# Patient Record
Sex: Male | Born: 1995 | Race: Black or African American | Hispanic: No | Marital: Single | Smoking: Never smoker
Health system: Southern US, Community
[De-identification: ages and names within clinical notes are randomized; demographics above are authoritative.]

## PROBLEM LIST (undated history)

## (undated) DIAGNOSIS — R079 Chest pain, unspecified: Secondary | ICD-10-CM

## (undated) DIAGNOSIS — R569 Unspecified convulsions: Secondary | ICD-10-CM

## (undated) HISTORY — DX: Chest pain, unspecified: R07.9

## (undated) HISTORY — DX: Unspecified convulsions: R56.9

## (undated) HISTORY — PX: CIRCUMCISION: SHX1350

---
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
## 2014-01-14 ENCOUNTER — Other Ambulatory Visit: Payer: Self-pay | Admitting: Family

## 2014-01-17 ENCOUNTER — Encounter: Payer: Self-pay | Admitting: Family

## 2014-02-18 ENCOUNTER — Other Ambulatory Visit: Payer: Self-pay | Admitting: Family

## 2014-03-16 ENCOUNTER — Other Ambulatory Visit: Payer: Self-pay | Admitting: Family

## 2014-03-17 ENCOUNTER — Telehealth: Payer: Self-pay | Admitting: *Deleted

## 2014-03-17 ENCOUNTER — Encounter: Payer: Self-pay | Admitting: *Deleted

## 2014-03-17 NOTE — Telephone Encounter (Signed)
Benjamin Wagner the patient's mom called and stated she needs a refill on Lamotrigine 25 mg. Has been electronically sent and patient's mom has been called. MB

## 2014-03-17 NOTE — Telephone Encounter (Signed)
This encounter was created in error - please disregard.

## 2014-03-19 DIAGNOSIS — Z0289 Encounter for other administrative examinations: Secondary | ICD-10-CM

## 2014-03-29 ENCOUNTER — Ambulatory Visit: Payer: Medicaid Other | Admitting: Pediatrics

## 2014-04-05 ENCOUNTER — Encounter: Payer: Self-pay | Admitting: *Deleted

## 2014-04-13 ENCOUNTER — Other Ambulatory Visit: Payer: Self-pay | Admitting: Family

## 2014-05-16 ENCOUNTER — Other Ambulatory Visit: Payer: Self-pay | Admitting: Family

## 2014-06-13 ENCOUNTER — Other Ambulatory Visit: Payer: Self-pay | Admitting: Family

## 2014-07-16 ENCOUNTER — Other Ambulatory Visit: Payer: Self-pay | Admitting: Family

## 2014-08-15 ENCOUNTER — Other Ambulatory Visit: Payer: Self-pay | Admitting: Family

## 2014-08-15 ENCOUNTER — Encounter: Payer: Self-pay | Admitting: Family

## 2014-09-01 ENCOUNTER — Encounter: Payer: Self-pay | Admitting: Pediatrics

## 2014-09-13 ENCOUNTER — Encounter: Payer: Self-pay | Admitting: Family

## 2014-09-13 ENCOUNTER — Ambulatory Visit (INDEPENDENT_AMBULATORY_CARE_PROVIDER_SITE_OTHER): Payer: Medicaid Other | Admitting: Family

## 2014-09-13 VITALS — BP 116/70 | HR 86 | Ht 68.5 in | Wt 150.6 lb

## 2014-09-13 DIAGNOSIS — G40309 Generalized idiopathic epilepsy and epileptic syndromes, not intractable, without status epilepticus: Secondary | ICD-10-CM | POA: Insufficient documentation

## 2014-09-13 DIAGNOSIS — F70 Mild intellectual disabilities: Secondary | ICD-10-CM | POA: Diagnosis not present

## 2014-09-13 DIAGNOSIS — G40409 Other generalized epilepsy and epileptic syndromes, not intractable, without status epilepticus: Secondary | ICD-10-CM

## 2014-09-13 MED ORDER — LAMOTRIGINE 25 MG PO TABS: ORAL_TABLET | ORAL | Status: DC

## 2014-09-13 NOTE — Patient Instructions (Signed)
Continue taking Lamotrigine 25 - 1 tablet in the morning, 2 tablets at midday and 3 tablets at night. I have sent in a refill of this medication to your pharmacy. It is important not to miss any doses of medication.  Remember to get at least 8-9 hours of sleep at night as lack of sleep may trigger seizures.  Please plan to return for follow up in 1 year or sooner if needed.

## 2014-09-13 NOTE — Progress Notes (Signed)
Patient: Benjamin Wagner MRN: [id number] Sex: male DOB: [birth year]  Provider: Elveria Rising, NP Location of Care: [facility]  Note type: Routine return visit  History of Present Illness: Referral Source: Dr. Alma Downs  History from: patient and his mother Chief Complaint: Epilepsy/Mild Intellectual Disabilities   Benjamin Wagner is a 19 y.o. 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 He was last seen by Dr. Sharene Skeans on June 15, 2013. Benjamin Wagner is taking and tolerating Lamotrigine, which has given him good seizure control. He and his mother report today that his last seizure occurred in May 2013. Since he was last seen, Benjamin Wagner has graduated from high school with a certificate. He is not employed. His mother said that he received some vocational training while in school but that he has been unable to get a job. Benjamin Wagner says that he goes to [employer] to spend time when he is not at home. His mother says that he sleeps well. She says that there are no concerns with his behavior and that he has been generally healthy since he was last seen.  Review of Systems: 12 system review as per HPI, otherwise negative.  Past Medical History  Diagnosis Date  . Seizures    Hospitalizations: No., Head Injury: No., Nervous System Infections: No., Immunizations up to date: Yes.   Past Medical History Comments: EEG on August 02, 2005, showed frequent three to four hertz regularly contoured spike in slow wave of activity that happened in one and a half to two-second intervals multiple times and also in single burst. This occurred at rest, with hyperventilation and has further myoclonic responses. He had a seizure in April 2007 with left focal motor activity with secondary generalization. On February 24, 2008, the patient had repeat EEG, which again showed frequent burst of generalized four hertz spike and slow  wave activity in single bursts and in clusters lasting up to two seconds in duration. The episodes did not occur during photic stimulation and were exacerbated by hyperventilation. His background was otherwise normal. He had a repeat EEG on October 08, 2012, that showed three episodes of generalized regularly contoured spike and slow wave activity followed by two episodes of four hertz regularly contoured spike and slow wave activity that was frontally prominent, but generalized. The episodes were lasted for a second or less. Background activity was otherwise normal.  The patient was initially placed on Lamictal because of his abnormal EEG and the potential for having further seizures. In 2009, he was on 25 mg twice daily. He had a normal examination. Plans were made to perform an EEG at [facility] and to consider tapering and discontinuing the medications. He was lost to follow up and when seen again was taking Lamotrigine at a dose of 25 mg in the morning and 50 mg in the afternoon and 75 mg at nighttime.  Other medical problems for this young man included acne, hypercholesterolemia, and intellectual disability. For reasons that are unclear, he had been placed on metformin, though he was not obese. He had normal blood sugar and hemoglobin A1c of 5.8.  In high school, he was in the self-maintained life skills class and participated in [place]. He has an older brother with cognitive impairment and autism. His IQ has been measured as full scale IQ 41 and verbal 45.   Surgical History Past Surgical History  Procedure Laterality Date  . Circumcision  1996    Family History family  history is not on file. Family History is otherwise negative for migraines, seizures, cognitive impairment, blindness, deafness, birth defects, chromosomal disorder, autism.  Social History History   Social History  . Marital Status: Single    Spouse Name: N/A  . Number of Children: N/A  . Years of Education:  N/A   Social History Main Topics  . Smoking status: Passive Smoke Exposure - Never Smoker  . Smokeless tobacco: Never Used     Comment: Mom smokes   . Alcohol Use: No  . Drug Use: No  . Sexual Activity: No   Other Topics Concern  . None   Social History Narrative   Educational level: 12th grade School Attending: N/A Living with:  mother  Hobbies/Interest: Enjoys going to the [employer]  School comments:  Benjamin DeerChristopher graduated with a certificate from [employer] in 2015.  Allergies No Known Allergies  Physical Exam BP 116/70 mmHg  Pulse 86  Ht 5' 8.5" (1.74 m)  Wt 150 lb 9.6 oz (68.312 kg)  BMI 22.56 kg/m2 General: alert, well developed, well nourished, in no acute distress, brown hair, brown eyes, right handedness  Head: normocephalic, no dysmorphic features  Ears, Nose and Throat: Otoscopic: Tympanic membranes normal. Pharynx: oropharynx is pink without exudates or tonsillar hypertrophy.  Neck: supple, full range of motion, no cranial or cervical bruits  Respiratory: auscultation clear  Cardiovascular: no murmurs, pulses are normal  Musculoskeletal: no skeletal deformities or apparent scoliosis  Skin: no rashes or neurocutaneous lesions, mild facial acne  Neurologic Exam  Mental Status: alert; oriented to person, place and year; knowledge is below normal for age; language is acceptable, he can name objects and follow commands. His speech is dysarthric but intelligible.  Cranial Nerves: visual fields are full to double simultaneous stimuli; extraocular movements are full and conjugate; pupils are around reactive to light; funduscopic examination shows sharp disc margins with normal vessels; symmetric facial strength; midline tongue and uvula; hearing is normal and symmetric. Motor: Normal strength, tone and mass; good fine motor movements; no pronator drift.  Sensory: intact responses to touch and temperature Coordination: good finger-to-nose,  rapid repetitive alternating movements and finger apposition  Gait and Station: normal gait and station: patient is able to walk on heels, toes and tandem without difficulty; balance is adequate; Romberg exam is negative; Gower response is negative  Reflexes: symmetric and diminished bilaterally; no clonus; bilateral flexor plantar responses.  Impression 1.  Generalized convulsive epilepsy 2.  Mild intellectual disabilities  Recommendations for plan of care [facility]. The patient is doing well on his current regimen of Lamotrigine. His mother says that she does not find the 3 times per day dosing to be cumbersome. Since this plan has worked well for Guardian Life InsuranceChristopher, I will not make changes in his plan of care. I explained to Benjamin DeerChristopher and his mother why that it was important for him to be seen in this office at least once per year to monitor his condition and his tolerance to the medication. I will see him back in follow up in 1 year or sooner if needed.   Current outpatient prescriptions:  .  lamoTRIgine (LAMICTAL) 25 MG tablet, TAKE 1 TABLET BY MOUTH EVERY MORNING, TAKE 2 TABLETS AT MIDDAY AND 3 TABLETS EVERY NIGHT AT BEDTIME, Disp: 180 tablet, Rfl: 5  The medication list was reviewed and reconciled. A complete medication list was provided to the patient/caregiver.  Patient Education I reminded Benjamin DeerChristopher of the need  for him to be compliant his medication regimen and of the relationship of getting adequate sleep to good seizure control in epilepsy. I instructed his mother to contact me if he has any breakthrough seizures so that he can be evaluated and his medication dose be adjusted if needed.   Total time spent with the patient was 25 minutes, of which 50% or more was spent in counseling and coordination of care.

## 2014-09-29 ENCOUNTER — Inpatient Hospital Stay
Admission: EM | Admit: 2014-09-29 | Discharge: 2014-09-30 | DRG: 153 | Disposition: A | Discharge status: Home / Self Care | Payer: BLUE CROSS/BLUE SHIELD | Attending: Pediatrics | Admitting: Pediatrics

## 2014-09-29 ENCOUNTER — Emergency Department: Payer: BLUE CROSS/BLUE SHIELD

## 2014-09-29 ENCOUNTER — Inpatient Hospital Stay: Payer: BLUE CROSS/BLUE SHIELD | Admitting: Pediatrics

## 2014-09-29 DIAGNOSIS — R252 Cramp and spasm: Secondary | ICD-10-CM | POA: Diagnosis present

## 2014-09-29 DIAGNOSIS — R1012 Left upper quadrant pain: Secondary | ICD-10-CM | POA: Diagnosis present

## 2014-09-29 DIAGNOSIS — J029 Acute pharyngitis, unspecified: Secondary | ICD-10-CM

## 2014-09-29 DIAGNOSIS — R6884 Jaw pain: Secondary | ICD-10-CM | POA: Diagnosis present

## 2014-09-29 DIAGNOSIS — J039 Acute tonsillitis, unspecified: Secondary | ICD-10-CM | POA: Diagnosis present

## 2014-09-29 DIAGNOSIS — R1310 Dysphagia, unspecified: Secondary | ICD-10-CM | POA: Diagnosis present

## 2014-09-29 DIAGNOSIS — R070 Pain in throat: Secondary | ICD-10-CM | POA: Diagnosis present

## 2014-09-29 DIAGNOSIS — E860 Dehydration: Secondary | ICD-10-CM | POA: Diagnosis present

## 2014-09-29 DIAGNOSIS — R590 Localized enlarged lymph nodes: Secondary | ICD-10-CM | POA: Diagnosis present

## 2014-09-29 DIAGNOSIS — J36 Peritonsillar abscess: Principal | ICD-10-CM | POA: Diagnosis present

## 2014-09-29 DIAGNOSIS — J45909 Unspecified asthma, uncomplicated: Secondary | ICD-10-CM | POA: Diagnosis present

## 2014-09-29 LAB — COMPREHENSIVE METABOLIC PANEL
ALT: 13 U/L (ref 0–55)
AST (SGOT): 19 U/L (ref 5–34)
Albumin/Globulin Ratio: 1.2 (ref 0.9–2.2)
Albumin: 4.4 g/dL (ref 3.5–5.0)
Alkaline Phosphatase: 78 U/L (ref 65–260)
Anion Gap: 14 (ref 5.0–15.0)
BUN: 11.6 mg/dL (ref 9.0–28.0)
Bilirubin, Total: 0.9 mg/dL (ref 0.2–1.2)
CO2: 23 mEq/L (ref 22–29)
Calcium: 9.9 mg/dL (ref 8.5–10.5)
Chloride: 99 mEq/L — ABNORMAL LOW (ref 100–111)
Creatinine: 1.1 mg/dL (ref 0.7–1.3)
Globulin: 3.8 g/dL — ABNORMAL HIGH (ref 2.0–3.6)
Glucose: 102 mg/dL — ABNORMAL HIGH (ref 70–100)
Potassium: 4.1 mEq/L (ref 3.5–5.1)
Protein, Total: 8.2 g/dL (ref 6.0–8.3)
Sodium: 136 mEq/L (ref 136–145)

## 2014-09-29 LAB — CBC AND DIFFERENTIAL
Basophils Absolute Automated: 0.04 10*3/uL (ref 0.00–0.20)
Basophils Automated: 0 %
Eosinophils Absolute Automated: 0 10*3/uL (ref 0.00–0.70)
Eosinophils Automated: 0 %
Hematocrit: 36.6 % — ABNORMAL LOW (ref 42.0–52.0)
Hgb: 12.7 g/dL — ABNORMAL LOW (ref 13.0–17.0)
Immature Granulocytes Absolute: 0.04 10*3/uL
Immature Granulocytes: 0 %
Lymphocytes Absolute Automated: 1.42 10*3/uL (ref 0.50–4.40)
Lymphocytes Automated: 10 %
MCH: 30.9 pg (ref 28.0–32.0)
MCHC: 34.7 g/dL (ref 32.0–36.0)
MCV: 89.1 fL (ref 80.0–100.0)
MPV: 10.9 fL (ref 9.4–12.3)
Monocytes Absolute Automated: 1.96 10*3/uL — ABNORMAL HIGH (ref 0.00–1.20)
Monocytes: 14 %
Neutrophils Absolute: 11.03 10*3/uL — ABNORMAL HIGH (ref 1.80–8.10)
Neutrophils: 76 %
Platelets: 210 10*3/uL (ref 140–400)
RBC: 4.11 10*6/uL — ABNORMAL LOW (ref 4.70–6.00)
RDW: 13 % (ref 12–15)
WBC: 14.45 10*3/uL — ABNORMAL HIGH (ref 3.50–10.80)

## 2014-09-29 LAB — MONONUCLEOSIS SCREEN: Mono Screen: NEGATIVE

## 2014-09-29 LAB — C-REACTIVE PROTEIN: C-Reactive Protein: 11.9 mg/dL — ABNORMAL HIGH (ref 0.0–0.8)

## 2014-09-29 LAB — CELL MORPHOLOGY
Cell Morphology: NORMAL
Platelet Estimate: NORMAL

## 2014-09-29 MED ORDER — CLINDAMYCIN PHOSPHATE IN D5W 600 MG/50ML IV SOLN
600.0000 mg | Freq: Once | INTRAVENOUS | Status: AC
Start: 2014-09-29 — End: 2014-09-29
  Administered 2014-09-29: 600 mg via INTRAVENOUS
  Filled 2014-09-29: qty 50

## 2014-09-29 MED ORDER — FENTANYL CITRATE 0.05 MG/ML IJ SOLN
1.0000 ug/kg | Freq: Once | INTRAMUSCULAR | Status: AC
Start: 2014-09-29 — End: 2014-09-29
  Administered 2014-09-29: 73 ug via INTRAVENOUS
  Filled 2014-09-29: qty 2

## 2014-09-29 MED ORDER — SODIUM CHLORIDE 0.9 % IV BOLUS
20.0000 mL/kg | Freq: Once | INTRAVENOUS | Status: AC
Start: 2014-09-29 — End: 2014-09-29
  Administered 2014-09-29: 1464 mL via INTRAVENOUS

## 2014-09-29 MED ORDER — DEXTROSE-SODIUM CHLORIDE 5-0.45 % IV SOLN
Freq: Once | INTRAVENOUS | Status: AC
Start: 2014-09-29 — End: 2014-09-29

## 2014-09-29 MED ORDER — CLINDAMYCIN PHOSPHATE IN D5W 600 MG/50ML IV SOLN
600.0000 mg | Freq: Three times a day (TID) | INTRAVENOUS | Status: DC
Start: 2014-09-30 — End: 2014-09-30
  Administered 2014-09-30 (×2): 600 mg via INTRAVENOUS
  Filled 2014-09-29 (×3): qty 50

## 2014-09-29 MED ORDER — MORPHINE SULFATE 4 MG/ML IJ/IV SOLN (WRAP)
4.0000 mg | Status: DC | PRN
Start: 2014-09-29 — End: 2014-09-30
  Administered 2014-09-29: 4 mg via INTRAVENOUS
  Filled 2014-09-29: qty 1

## 2014-09-29 MED ORDER — KETOROLAC TROMETHAMINE 30 MG/ML IJ SOLN
30.0000 mg | Freq: Once | INTRAMUSCULAR | Status: AC
Start: 2014-09-29 — End: 2014-09-29
  Administered 2014-09-29: 30 mg via INTRAVENOUS
  Filled 2014-09-29: qty 1

## 2014-09-29 MED ORDER — DEXTROSE-SODIUM CHLORIDE 5-0.45 % IV SOLN
INTRAVENOUS | Status: DC
Start: 2014-09-29 — End: 2014-09-30

## 2014-09-29 MED ORDER — METHYLPREDNISOLONE SODIUM SUCC 125 MG IJ SOLR
125.0000 mg | Freq: Once | INTRAMUSCULAR | Status: AC
Start: 2014-09-29 — End: 2014-09-29
  Administered 2014-09-29: 125 mg via INTRAVENOUS
  Filled 2014-09-29: qty 2

## 2014-09-29 MED ORDER — KETOROLAC TROMETHAMINE 30 MG/ML IJ SOLN
30.0000 mg | Freq: Once | INTRAMUSCULAR | Status: AC
Start: 2014-09-30 — End: 2014-09-30
  Administered 2014-09-30: 30 mg via INTRAVENOUS
  Filled 2014-09-29: qty 1

## 2014-09-29 MED ORDER — IOHEXOL 350 MG/ML IV SOLN
100.0000 mL | Freq: Once | INTRAVENOUS | Status: AC | PRN
Start: 2014-09-29 — End: 2014-09-29
  Administered 2014-09-29: 100 mL via INTRAVENOUS

## 2014-09-29 NOTE — Student H&P (Signed)
Barry Gross is an 19 y.o. male with no significant PMH who presents with 3 days of severe sore throat. He states that the pain was 10/10 3 days ago and he could not eat, drink, or sleep. He also developed right ear pain, chest pain, chills, and LUQ pain over the next two days. Two days ago he went to his PCP who gave him lidocaine mouth wash to gargle with no relief. He went to urgent care yesterday who instructed him to come to the ED. He denies any sick contacts or recent travel.    History reviewed. No pertinent past medical history.     PSH: none    FH:  Paternal grandmother with high cholesterol, diabetes  Father had cancer (uncertain of type)    Social history: lives at home with mother and younger brother (10). 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    Allergies: No Known Allergies    Active Problems:    Throat pain    Blood pressure 133/77, pulse 78, temperature 98.5 F (36.9 C), temperature source Temporal Artery, resp. rate 18, height 1.68 m (5' 6.14"), weight 76.8 kg (169 lb 5 oz), SpO2 100 %.    Review of Systems   Constitutional: Positive for fever and chills.   HENT: Positive for ear pain (right) and sore throat. Negative for congestion.    Eyes: Negative for blurred vision, double vision, pain and discharge.   Respiratory: Positive for cough. Negative for shortness of breath and wheezing.    Cardiovascular: Positive for chest pain.   Gastrointestinal: Negative for nausea, vomiting, abdominal pain, diarrhea and constipation.   Musculoskeletal: Negative for myalgias.   Skin: Negative for rash.   Neurological: Positive for headaches.       Physical Exam   Constitutional: He is oriented to person, place, and time. He appears well-developed and well-nourished. No distress.   HENT:   Head: Normocephalic and atraumatic.   Left Ear: External ear normal.   Right tonsil erythematous. Uvular deviation to left. No exudate. Right TM erythematous.   Eyes: Conjunctivae and EOM are normal. Pupils are equal, round, and  reactive to light. Right eye exhibits no discharge. Left eye exhibits no discharge.   Cardiovascular: Normal rate, regular rhythm and normal heart sounds.  Exam reveals no gallop and no friction rub.    No murmur heard.  Pulmonary/Chest: Effort normal and breath sounds normal. No respiratory distress. He has no wheezes. He has no rales. He exhibits no tenderness.   Abdominal: Soft. Bowel sounds are normal. He exhibits no distension and no mass. There is no tenderness. There is no rebound and no guarding.   Musculoskeletal: Normal range of motion.   Lymphadenopathy:     He has cervical adenopathy.   Neurological: He is alert and oriented to person, place, and time.   Skin: Skin is warm and dry. No rash noted. He is not diaphoretic.       Assessment:  19 year old male with no PMH who presents with peritonsillar abscess.    Plan:  CV: Stable, NTD  Resp: Stable, NTD  ID:  - IV clindamycin  Neuro:  - Toradol PRN pain  - Morphine PRN pain  HEENT:  - Consult ENT regarding peritonsillar abscess  FEN/GI:  - Maintenance IVF    Joetta Manners  09/29/2014

## 2014-09-29 NOTE — ED Provider Notes (Addendum)
Physician/Midlevel provider first contact with patient: 09/29/14 1722         History     Chief Complaint   Patient presents with   . Sore Throat     HPI Comments: Patient with a 5 day history of sore throat who has attempted to take Augmentin sterilize been unable to swallow at this time.  Severe sore throat and her lymph nodes on the right.  Tender throat on the left.  Still able to breathe without stridor, difficulty.  Unable to swallow solids    Patient is a 19 y.o. male presenting with pharyngitis. The history is provided by the patient. No language interpreter was used.   Sore Throat  This is a new problem. The current episode started in the past 7 days. The problem has been unchanged. Associated symptoms include abdominal pain, anorexia, congestion, a fever, nausea, neck pain, a sore throat and swollen glands. Pertinent negatives include no arthralgias, change in bowel habit, chest pain, chills, coughing, diaphoresis, fatigue, headaches, joint swelling, myalgias, numbness, rash, urinary symptoms, vertigo, visual change, vomiting or weakness. Associated symptoms comments: Left upper quadrant abdominal pain. Nothing aggravates the symptoms. He has tried nothing for the symptoms.            History reviewed. No pertinent past medical history.    History reviewed. No pertinent past surgical history.    History reviewed. No pertinent family history.    Social  History   Substance Use Topics   . Smoking status: Never Smoker    . Smokeless tobacco: Not on file   . Alcohol Use: No       .     No Known Allergies    Home Medications     Last Medication Reconciliation Action:  Complete Vivia Birmingham, RN 09/29/2014  9:54 PM                  amoxicillin-clavulanate (AUGMENTIN) 875-125 MG per tablet     Take 1 tablet by mouth 2 (two) times daily.           Review of Systems   Constitutional: Positive for fever and appetite change. Negative for chills, diaphoresis, activity change, fatigue and unexpected weight change.    HENT: Positive for congestion and sore throat. Negative for ear discharge, ear pain, mouth sores, nosebleeds, postnasal drip, rhinorrhea, sinus pressure, trouble swallowing and voice change.    Eyes: Negative for photophobia, pain, discharge, redness and itching.   Respiratory: Negative for cough, choking, chest tightness, shortness of breath, wheezing and stridor.    Cardiovascular: Negative for chest pain and palpitations.   Gastrointestinal: Positive for nausea, abdominal pain and anorexia. Negative for vomiting, diarrhea, constipation, abdominal distention and change in bowel habit.        Left upper quadrant abdominal pain   Genitourinary: Negative for frequency, hematuria, flank pain, decreased urine volume, enuresis and difficulty urinating.   Musculoskeletal: Positive for neck pain. Negative for myalgias, back pain, joint swelling, arthralgias, gait problem and neck stiffness.   Skin: Negative for color change, pallor and rash.   Allergic/Immunologic: Negative for environmental allergies, food allergies and immunocompromised state.   Neurological: Negative for dizziness, vertigo, seizures, syncope, weakness, light-headedness, numbness and headaches.   Hematological: Negative for adenopathy. Does not bruise/bleed easily.   Psychiatric/Behavioral: Negative for behavioral problems and agitation. The patient is not nervous/anxious.        Physical Exam    BP: 138/75 mmHg, Heart Rate: 68, Temp: 100 F (37.8  C), Resp Rate: 20, SpO2: 98 %, Weight: 73.2 kg    Physical Exam   Constitutional: He appears well-developed and well-nourished.   HENT:   Head: Normocephalic and atraumatic.   Right Ear: External ear normal.   Left Ear: External ear normal.   Mouth/Throat: No oropharyngeal exudate.   Patient with red pharyngitis bilaterally.  No uvular deviation, no jaw pain on range of motion.  Tender right anterior cervical lymph node.  No adenoid obstruction   Eyes: Conjunctivae are normal. Pupils are equal, round, and  reactive to light. Right eye exhibits no discharge. No scleral icterus.   Neck: Normal range of motion. No JVD present. No tracheal deviation present. No thyromegaly present.   Cardiovascular: Normal rate, regular rhythm, normal heart sounds and intact distal pulses.  Exam reveals no gallop and no friction rub.    No murmur heard.  Pulmonary/Chest: Effort normal. No respiratory distress. He has no wheezes. He has no rales. He exhibits no tenderness.   Abdominal: Soft. He exhibits no distension and no mass. There is no tenderness. There is no rebound and no guarding.   Left upper quadrant abdominal pain.  No palpable spleen   Musculoskeletal: Normal range of motion. He exhibits no edema or tenderness.   Lymphadenopathy:     He has no cervical adenopathy.   Neurological: He is alert. He displays normal reflexes. No cranial nerve deficit. He exhibits normal muscle tone. Coordination normal.   Skin: Skin is warm. No rash noted. He is not diaphoretic. No erythema. No pallor.   Psychiatric: He has a normal mood and affect. His behavior is normal. Thought content normal.         MDM and ED Course     ED Medication Orders     Start Ordered     Status Ordering Provider    09/29/14 1851 09/29/14 1850  clindamycin (CLEOCIN) 600mg  in D5W 50mL IVPB (premix)   Once     Route: Intravenous  Ordered Dose: 600 mg     Last MAR action:  Stopped Sharyne Richters D    09/29/14 1851 09/29/14 1850  methylPREDNISolone sodium succinate (Solu-MEDROL) injection 125 mg   Once     Route: Intravenous  Ordered Dose: 125 mg     Last MAR action:  Given Meryem Haertel D    09/29/14 1724 09/29/14 1723  sodium chloride 0.9 % bolus 1,464 mL   Once     Route: Intravenous  Ordered Dose: 20 mL/kg     Last MAR action:  Stopped Letha Mirabal D    09/29/14 1724 09/29/14 1723  dextrose  5 % and 0.45 % NaCl infusion   Once     Route: Intravenous     Last MAR action:  New Bag Selmer Adduci D    09/29/14 1724 09/29/14 1723  fentaNYL (SUBLIMAZE) injection 73  mcg   Once     Route: Intravenous  Ordered Dose: 1 mcg/kg     Last MAR action:  Given Tais Koestner D    09/29/14 1724 09/29/14 1723  ketorolac (TORADOL) injection 30 mg   Once     Route: Intravenous  Ordered Dose: 30 mg     Last MAR action:  Given Lakena Sparlin D              MDM  Number of Diagnoses or Management Options  Dehydration:   Peritonsillar abscess:   Pharyngitis:   Diagnosis management comments: 5:27 PM  SaO2 97-99%  normal  Additional Social History  The patient lives with family, goes to school, and does not smoke cigarettes or is not exposed to cigarette smoke.    Phillis Knack. Khamron Gellert MD, have reviewed old medical records if available and needed including previous emergency department visits, hospital admissions, laboratories, radiologic studies, EKG's, and previous treatment plans in the care of this patient.    Nurse's notes, vital signs, and past medical history as well as social history reviewed at length    Pharyngitis, mono, viral, bacterial, dehydration.  We will do IV fluids, pain medications subsequent imaging if warranted    Subsequent imaging reveals right peritonsillar abscess.  We will admit.  ENT consult.         Amount and/or Complexity of Data Reviewed  Clinical lab tests: reviewed  Tests in the radiology section of CPT: reviewed    Risk of Complications, Morbidity, and/or Mortality  Presenting problems: moderate  Diagnostic procedures: moderate  Management options: moderate    Patient Progress  Patient progress: stable         Procedures  Results     Procedure Component Value Units Date/Time    Epstein-Barr Virus VCA Antibody Panel [[id number]] Collected:  09/29/14 1735    Specimen Information:  Blood Updated:  09/29/14 2149    Cell MorpHology [[id number]] Collected:  09/29/14 1735     Cell Morphology: Normal Updated:  09/29/14 1812     Platelet Estimate Normal     CBC with differential [[id number]]  (Abnormal) Collected:  09/29/14 1735    Specimen Information:  Blood / Blood Updated:   09/29/14 1811     WBC 14.45 (H) x10 3/uL      Hgb 12.7 (L) g/dL      Hematocrit 21.3 (L) %      Platelets 210 x10 3/uL      RBC 4.11 (L) x10 6/uL      MCV 89.1 fL      MCH 30.9 pg      MCHC 34.7 g/dL      RDW 13 %      MPV 10.9 fL      Neutrophils 76 %      Lymphocytes Automated 10 %      Monocytes 14 %      Eosinophils Automated 0 %      Basophils Automated 0 %      Immature Granulocyte 0 %      Neutrophils Absolute 11.03 (H) x10 3/uL      Abs Lymph Automated 1.42 x10 3/uL      Abs Mono Automated 1.96 (H) x10 3/uL      Abs Eos Automated 0.00 x10 3/uL      Absolute Baso Automated 0.04 x10 3/uL      Absolute Immature Granulocyte 0.04 x10 3/uL     Comprehensive metabolic panel [[id number]]  (Abnormal) Collected:  09/29/14 1735    Specimen Information:  Blood Updated:  09/29/14 1803     Glucose 102 (H) mg/dL      BUN 96.2 mg/dL      Creatinine 1.1 mg/dL      Sodium 952 mEq/L      Potassium 4.1 mEq/L      Chloride 99 (L) mEq/L      CO2 23 mEq/L      CALCIUM 9.9 mg/dL      Protein, Total 8.2 g/dL      Albumin 4.4 g/dL      AST (SGOT) 19 U/L  ALT 13 U/L      Alkaline Phosphatase 78 U/L      Bilirubin, Total 0.9 mg/dL      Globulin 3.8 (H) g/dL      Albumin/Globulin Ratio 1.2      Anion Gap 14.0     C Reactive Protein [[id number]]  (Abnormal) Collected:  09/29/14 1735    Specimen Information:  Blood Updated:  09/29/14 1803     C-Reactive Protein 11.9 (H) mg/dL     Mononucleosis Screen [[id number]] Collected:  09/29/14 1735    Specimen Information:  Blood Updated:  09/29/14 1803     Mono Screen Negative       Ct Soft Tissue Neck With Contrast    09/29/2014    There is a right peritonsillar abscess present.  Results given via telephone to Dr. Dory Peru on 09/29/2014 at 7:36 PM  Trilby Drummer, MD  09/29/2014 7:36 PM     US Abdomen Complete    09/29/2014    No splenomegaly or other significant upper abdominal abnormality.  Annabell Sabal, MD  09/29/2014 6:31 PM     Clinical Impression & Disposition     Clinical Impression  Final  diagnoses:   Pharyngitis   Dehydration   Peritonsillar abscess        ED Disposition     Admit Bed Type: General [8]  Admitting Physician: Marvel Plan [facility] [[id number]]  Patient Class: Observation [104]             Current Discharge Medication List                      Ames Coupe, MD  09/30/14 4132    Ames Coupe, MD  09/30/14 4401    Ames Coupe, MD  09/30/14 [phone]

## 2014-09-29 NOTE — Plan of Care (Signed)
19 year old admitted for R. Peritonsillar abscess.  Assessment and history completed.  Pain control discussed with pt and family.  VSS.  Will monitor closely.

## 2014-09-29 NOTE — ED Notes (Signed)
Seen by PMD Tuesday, rx lidocaine po. Last night he was seen at urgent care (strep, flu negative), told throat 'looked terrible' and rx augmentin and prednisone. Only 1 dose of augmentin taken due to difficulty swallowing with pain. No prednisone given.

## 2014-09-30 DIAGNOSIS — J039 Acute tonsillitis, unspecified: Secondary | ICD-10-CM | POA: Diagnosis present

## 2014-09-30 LAB — EPSTEIN-BARR VIRUS VCA ANTIBODY PANEL
EBV EBNA Ab, IgG: 0.28 (ref <=0.90)
EBV VCA Ab, IgG: 0.15 (ref <=0.90)
EBV VCA Ab, IgM: 0.22 (ref <=0.90)

## 2014-09-30 MED ORDER — AMOXICILLIN-POT CLAVULANATE 600-42.9 MG/5ML PO SUSR
900.0000 mg | Freq: Two times a day (BID) | ORAL | Status: DC
Start: 2014-09-30 — End: 2014-10-18

## 2014-09-30 MED ORDER — KETOROLAC TROMETHAMINE 30 MG/ML IJ SOLN
30.0000 mg | Freq: Four times a day (QID) | INTRAMUSCULAR | Status: DC | PRN
Start: 2014-09-30 — End: 2014-09-30
  Administered 2014-09-30: 30 mg via INTRAVENOUS
  Filled 2014-09-30: qty 1

## 2014-09-30 MED ORDER — IBUPROFEN 100 MG PO CHEW
400.0000 mg | CHEWABLE_TABLET | Freq: Four times a day (QID) | ORAL | Status: DC | PRN
Start: 2014-09-30 — End: 2014-11-14

## 2014-09-30 MED ORDER — MORPHINE SULFATE 2 MG/ML IJ/IV SOLN (WRAP)
2.0000 mg | Status: DC | PRN
Start: 2014-09-30 — End: 2014-09-30

## 2014-09-30 MED ORDER — SODIUM CHLORIDE 0.9 % IV SOLN
10.0000 mg | Freq: Once | INTRAVENOUS | Status: AC
Start: 2014-09-30 — End: 2014-09-30
  Administered 2014-09-30: 10 mg via INTRAVENOUS
  Filled 2014-09-30: qty 1

## 2014-09-30 MED ORDER — ACETAMINOPHEN 160 MG PO CHEW
640.0000 mg | CHEWABLE_TABLET | ORAL | Status: DC | PRN
Start: 2014-09-30 — End: 2014-11-14

## 2014-09-30 MED ORDER — HYDROCODONE-ACETAMINOPHEN 7.5-325 MG/15ML PO SOLN
10.0000 mL | ORAL | Status: DC | PRN
Start: 2014-09-30 — End: 2014-09-30
  Administered 2014-09-30 (×2): 10 mL via ORAL
  Filled 2014-09-30 (×2): qty 15

## 2014-09-30 MED ORDER — PREDNISOLONE SODIUM PHOSPHATE 15 MG/5ML PO SOLN
ORAL | Status: DC
Start: 2014-09-30 — End: 2014-10-18

## 2014-09-30 MED ORDER — HYDROCODONE-ACETAMINOPHEN 7.5-325 MG/15ML PO SOLN
10.0000 mL | Freq: Four times a day (QID) | ORAL | Status: DC | PRN
Start: 2014-09-30 — End: 2014-11-14

## 2014-09-30 NOTE — Progress Notes (Signed)
Pt is a 19 yr old male admitted 09/28/14 for a retropharyngeal abscess.  Pt treated with IVF and IV antibiotics, steroids, IV and oral pain medications.  Pt's cond improved, tolerating po intake, pain controlled with oral medications.  Pt d/c'd home on antibiotics and pain medication, to f/u with ENT.

## 2014-09-30 NOTE — H&P (Signed)
PEDIATRIC ADMISSION HISTORY AND PHYSICAL EXAM    Admit Date and Time:  09/29/2014  5:14 PM   Today's Date and Time: 09/30/14   Patient Name: Barry Gross  Attending Physician: Gertie Fey, MD    Active Hospital Problems    Diagnosis   . Throat pain         Assessment:   19 y.o. male with tonsillitis with right tonsillar phlegmonous/ abscess changes    Er course:  Temp 100, pulse 68, O2 sat 98% on ra, rr 20, bp 138/75.   Per er note pt with red pharynx with tenderness to right anterior cervical lymph node, no jaw pain, no uvular deviation.   In ER pt given clindamycin, solumedrol 125mg , NS bolus of IVF  Fentanyl and toradol for pain.   Wbc 14.45, hgb 12.7, plt 210, 76% neut, 10% lymph, 14% mono  crp 11.9, Cr 1.1, bicarb 23  Soft tissue neck CT:   There is a multilobulated low-density peripherally enhancing  lesion in the right palatine tonsil. It measures 1.9 x 2.5 x 1.5 cm  (height x AP x width). This is felt to represent an abscess. Enlarged  lymph nodes are present in the bilateral upper internal jugular chain  nodal stations, especially on the right.    There is mild prominence of the adenoids and lingual tonsils.   RUQ usx:   IMPRESSION:   No splenomegaly or other significant upper abdominal  Abnormality.    Er provider spoke with ENT on call , Dr. Edward Jolly who will consult on the floor.     Floor: pt on IVF, clinda, and pain management.   On floor able to sleep   Eating and drinking better.   Pain manageable with pain medications.   ENT, Dr Edward Jolly, consulted. After evaluating pt, at this time does not recommend drainage. Okay to discharge home and follow up in office as outpatient. Cont clinda or augmentin for 10 more days, taper steroids over 5 days and if worsening then would drain in office.   Pt and mom understood.   Pt feels better and feels comfortable to go home.   Pt afebrile, eating better, pain under control with pain meds.   D/c home on augmentin.   Plan:   D/c home  Diagnosis:   1. Right  tonsillar phlegmon/ abscess/ tonsillitis    Instructions:   1. augmentin ES (600mg / 5ml) 7.4ml twice a day for 10 days  2. orapred 14 ml once a day for 3 days then 7ml once a day for 2 days (start tomorrow evening)  2. Ibuprofen 400-600 mg every 6hours as needed for pain or fever (take with solids)  3. hycet 10ml every 6 hours as needed for severe pain (take 4 hours apart from tyelnol)  4. Tylenol 640-650mg  every 4 hours as needed for pain or fever (take 4 hours apart from hycet)  5. May use cool mist humidifier  6. May gargle with warm salt water every 4 hours as needed for pain  7. Start with soft diet, as you are improving then advance to regular diet  8. Drink plenty of liquids.     Please call ENT office or your pediatrician or return to emergency room if he has persistent worsening of pain not controlled by medications presribed, he is unable to swallow liquids, he has trouble breathing, fever (temperature greater then 100.4) persists beyond 48 more hours, the area seems to be getting much more swollen,  or any other concerns.  Follow up:   1. Dr. Edward Jolly or associate in 5-7 days, sooner with any concerns.        Discharge Medication List      Taking          acetaminophen 160 MG chewable tablet   Dose:  640 mg   Commonly known as:  TYLENOL   Chew 4 tablets (640 mg total) by mouth every 4 (four) hours as needed for Pain or Fever.   Notes to Patient:  Do not give if giving hycet       amoxicillin-clavulanate 600-42.9 MG/5ML suspension   Dose:  900 mg   Commonly known as:  AUGMENTIN ES-600   Take 7.5 mLs (900 mg total) by mouth 2 (two) times daily. For 10 days       HYDROcodone-acetaminophen 7.5-325 MG/15ML solution   Dose:  10 mL   Commonly known as:  HYCET   Take 10 mLs by mouth every 6 (six) hours as needed (severe pain).       ibuprofen 100 MG chewable tablet   Dose:  400-600 mg   Commonly known as:  ADVIL,MOTRIN   Chew 4-6 tablets (400-600 mg total) by mouth every 6 (six) hours as needed for Pain or Fever.        prednisoLONE 15 MG/5ML solution   Commonly known as:  ORAPRED   14ml x 3 days once daily , then 7ml once daily  for 2 days then stop         STOP taking these medications          amoxicillin-clavulanate 875-125 MG per tablet   Commonly known as:  AUGMENTIN   Replaced by:  amoxicillin-clavulanate 600-42.9 MG/5ML suspension           .  History of Presenting Illness:   Barry Gross is a 19 y.o. male who presents to the hospital with throat pain  3 days PtA in evening with st  2 days pta seen by pmd, rxed with lidocaine swish and spit. Strep was neg  1 days PTA with increased throat pain, right ear pain, chest pain, L upper quadrant pain, headache, fever.   That night went to urgent care. The right tonsil was bad and uvula was bad, if worse then go to er. She prescribed lidocaine swish and spit, prednisone, and augmentin. It was not filled that night because pharmacy did not have rx  On day of admission at 3am had a lot of right jaw pain where tonsil was. Was not like himself because of pain, so could not fall asleep  Mom filled medications and he received the first dose of augmentin, did not take prednisone because hurt to swallow pills.   Review of Systems:   Hard to swallow food for 3 days, every day was getting worse.   Only thing he could potentially take down was jello.   So having more and more trouble swallowing liquids.   Not dizzy, voiding well  Pt forcing himself to drink propel and water to stay hydrated.     In last month had cold, but not sure, may have been a while.     Chest pain has been there for months. In middle or right or left.   No burning pain in chest.     No cough no runny nose.   Birth History/Growth & Development   FT, no complications    Past Medical History:     None  Spring allergies - has not come  yet, usually in April with stuffy nose , runny nose, and sneezing.   Asthma really mild.   1st time throat hurt like this   Hospitalizations:     none   Past Surgical History:    History reviewed. No pertinent past surgical history.  none   Social History:   Lives with mom , 15yo brother.   One friend sick with sore throat around same time, he has improved. dont think he got antibiotic.   [employer] 1st year.     Allergies:   No Known Allergies    Medications:     Prior to Admission medications    Medication Sig Start Date End Date Taking? Authorizing Provider   amoxicillin-clavulanate (AUGMENTIN) 875-125 MG per tablet Take 1 tablet by mouth 2 (two) times daily. 09/29/14  Yes [provider]     Tylenol liquid - helped headache    Immunizations:     utd  Physical Exam:   Temp: 100 F (37.8 C)  Heart Rate: 68  Resp Rate: 20  BP: 138/75 mmHg  SpO2: 98 %  Height: 188 cm (6\' 2" )  Weight: 73.2 kg (161 lb 6 oz)   Filed Vitals:    09/30/14 0023 09/30/14 0406 09/30/14 0840 09/30/14 1227   BP: 131/62 105/56 105/57 118/56   Pulse: 66 61 53 62   Temp: 98 F (36.7 C) 98.4 F (36.9 C) 98.5 F (36.9 C) 98 F (36.7 C)   TempSrc: Temporal Artery Temporal Artery Temporal Artery Temporal Artery   Resp: 16 16 18 18    Height:       Weight:       SpO2: 99% 100% 99% 100%       Gen: NAD, pleasant,   HEENT:  nml TM, right tonsil larger then left, but not kissing, some exudate, space inbetween, uvula midline, no muffled voice to slight, reports trismus, can open mouth pretty good  Neck: no swelling, FROM, some pain over right tonsillar area on anterior neck with palpation.   Chest: CTA B  Heart:  S1S2 rrr, no murmur  Abdomen: soft, non tender, non distended, no hsm, no pain  Neuro: grossly intact  Skin: nml  Musculoskeletal: moves all 4 ext well        Labs:     Results     Procedure Component Value Units Date/Time    Epstein-Barr Virus VCA Antibody Panel [[id number]] Collected:  09/29/14 1735    Specimen Information:  Blood Updated:  09/30/14 1357     EBV VCA Ab, IgM 0.22      EBV VCA Ab, IgG 0.15      EBV EBNA Ab, IgG 0.28     Cell MorpHology [[id number]] Collected:  09/29/14 1735     Cell Morphology: Normal  Updated:  09/29/14 1812     Platelet Estimate Normal     CBC with differential [[id number]]  (Abnormal) Collected:  09/29/14 1735    Specimen Information:  Blood / Blood Updated:  09/29/14 1811     WBC 14.45 (H) x10 3/uL      Hgb 12.7 (L) g/dL      Hematocrit 21.3 (L) %      Platelets 210 x10 3/uL      RBC 4.11 (L) x10 6/uL      MCV 89.1 fL      MCH 30.9 pg      MCHC 34.7 g/dL      RDW 13 %      MPV 10.9 fL  Neutrophils 76 %      Lymphocytes Automated 10 %      Monocytes 14 %      Eosinophils Automated 0 %      Basophils Automated 0 %      Immature Granulocyte 0 %      Neutrophils Absolute 11.03 (H) x10 3/uL      Abs Lymph Automated 1.42 x10 3/uL      Abs Mono Automated 1.96 (H) x10 3/uL      Abs Eos Automated 0.00 x10 3/uL      Absolute Baso Automated 0.04 x10 3/uL      Absolute Immature Granulocyte 0.04 x10 3/uL     Comprehensive metabolic panel [[id number]]  (Abnormal) Collected:  09/29/14 1735    Specimen Information:  Blood Updated:  09/29/14 1803     Glucose 102 (H) mg/dL      BUN 54.0 mg/dL      Creatinine 1.1 mg/dL      Sodium 981 mEq/L      Potassium 4.1 mEq/L      Chloride 99 (L) mEq/L      CO2 23 mEq/L      CALCIUM 9.9 mg/dL      Protein, Total 8.2 g/dL      Albumin 4.4 g/dL      AST (SGOT) 19 U/L      ALT 13 U/L      Alkaline Phosphatase 78 U/L      Bilirubin, Total 0.9 mg/dL      Globulin 3.8 (H) g/dL      Albumin/Globulin Ratio 1.2      Anion Gap 14.0     C Reactive Protein [[id number]]  (Abnormal) Collected:  09/29/14 1735    Specimen Information:  Blood Updated:  09/29/14 1803     C-Reactive Protein 11.9 (H) mg/dL     Mononucleosis Screen [[id number]] Collected:  09/29/14 1735    Specimen Information:  Blood Updated:  09/29/14 1803     Mono Screen Negative           Rads:   Ct Soft Tissue Neck With Contrast    09/29/2014   Clinical history: Sore throat, difficulty swallowing  Findings: Contrast-enhanced CT neck soft tissues was performed. Intravenous Omnipaque 350, 100 cc was utilized. There are no prior  studies.  There is enlargement of the bilateral palatine tonsils, especially on the right. There is a multilobulated low-density peripherally enhancing lesion in the right palatine tonsil. It measures 1.9 x 2.5 x 1.5 cm (height x AP x width). This is felt to represent an abscess. Enlarged lymph nodes are present in the bilateral upper internal jugular chain nodal stations, especially on the right.  There is mild prominence of the adenoids and lingual tonsils. There is no neoplasm of the aerodigestive tract. The bilateral parotid and submandibular glands are normal. The thyroid gland is in normal location.     09/29/2014    There is a right peritonsillar abscess present.  Results given via telephone to Dr. Dory Peru on 09/29/2014 at 7:36 PM  Trilby Drummer, MD  09/29/2014 7:36 PM     US Abdomen Complete    09/29/2014   CLINICAL INFORMATION: 19 year old. Severe sore throat. Left upper quadrant pain.  TECHNIQUE AND FINDINGS: Abdominal ultrasound. No comparisons.  GALLBLADDER: Normal position. Normal size. No gallstones, gallbladder wall thickening, direct gallbladder tenderness, or pericholecystic abnormality.  BILE DUCTS: No intrahepatic dilatation. Partially obscured common duct. Visualized common duct within normal limits. Proximal common duct measures  4 mm.    LIVER: Normal size and configuration. No surface or parenchymal nodularity. Echogenicity within normal limits. Visualized intrahepatic vasculature appears normal. No focal liver lesion identified.  SPLEEN: Normal size and echogenicity. No significant intrasplenic or perisplenic abnormality demonstrated.  PANCREAS: Partially obscured. No abnormality demonstrated.  AORTA & IVC: Partially obscured. No significant abnormality identified.  KIDNEYS: Limited survey imaging. No evidence of obstruction. Within normal limits in length.  PERITONEUM & OTHER: No ascites.     09/29/2014    No splenomegaly or other significant upper abdominal abnormality.  Annabell Sabal, MD   09/29/2014 6:31 PM         Signed by: Gertie Fey

## 2014-09-30 NOTE — Consults (Signed)
CONSULTATION    Date Time: 09/30/2014 6:28 PM  Patient Name: Barry Gross  Requesting Physician: No att. providers found      Reason for Consultation:   Right tonsil abscess    History:   Barry Gross is a 19 y.o. male who presents to the hospital on 09/29/2014 with a one week history of a right tonsillitis. He had been treated with antibiotics and oral steroids on Wed at a urgent care and mom had filled the prescription on Thursday and given the first dose. The pain however became more severe with a 9/10 scale, dysphagia, and fatigue so she brought her child to the ED. At that time a CT was done which showed a PTA on the right side. He was started on clindamycin and given IV steroids. The pain this am was down to a 2/10. A consult was requested and this infection was triggered by a cold which started last weekend on Sunday.     Past Medical History:   History reviewed. No pertinent past medical history.    Past Surgical History:   History reviewed. No pertinent past surgical history.    Family History:   History reviewed. No pertinent family history.    Social History:     History     Social History   . Marital Status: Single     Spouse Name: N/A   . Number of Children: N/A   . Years of Education: N/A     Social History Main Topics   . Smoking status: Never Smoker    . Smokeless tobacco: Not on file   . Alcohol Use: No   . Drug Use: No   . Sexual Activity: Not on file     Other Topics Concern   . Not on file     Social History Narrative   . No narrative on file       Allergies:   No Known Allergies    Medications:     Current Facility-Administered Medications   Medication Dose Route Frequency   . clindamycin  600 mg Intravenous Q8H SCH       Review of Systems:     Review of Systems - General ROS: positive for  - fatigue and fever  Ophthalmic ROS: negative  ENT ROS: positive for - headaches, nasal discharge and sore throat  Allergy and Immunology ROS: negative  Hematological and Lymphatic ROS: positive for  - swollen lymph nodes  Endocrine ROS: negative  Respiratory ROS: no cough, shortness of breath, or wheezing  Cardiovascular ROS: no chest pain or dyspnea on exertion  Gastrointestinal ROS: no abdominal pain, change in bowel habits, or black or bloody stools  Musculoskeletal ROS: negative  Neurological ROS: no TIA or stroke symptoms  Dermatological ROS: negative    Physical Exam:     Filed Vitals:    09/30/14 1227   BP: 118/56   Pulse: 62   Temp: 98 F (36.7 C)   Resp: 18   SpO2: 100%     Physical Examination: General appearance - appearance: alert, well appearing, and in no distress"  Mental status - normal mood, behavior, speech, dress, motor activity, and thought processes for patient's age  Eyes - pupils equal and reactive, extraocular eye movements intact  Ears - bilateral TM's and external ear canals normal  Nose - normal and patent, no erythema, discharge or polyps  Mouth - mucous membranes moist, pharynx red with no lesions, uvula midline, right tonsil 2+ with left 2 size and  no pus but red and mildly swollen with minimal asymmetry, normal gum, lip, dentition.  Neck - supple, shoddy upper cervical adenopathy  Lymphatics - shoddy lymphadenopathy, no hepatosplenomegaly, few small anterior cervical nodes  Chest - clear to auscultation, no wheezes, rales or rhonchi, symmetric air entry  Heart - normal rate, regular rhythm, normal S1, S2, no murmurs, rubs, clicks or gallops  Abdomen - soft, nontender, nondistended, no masses or organomegaly  Neurological - appropriate level of alertness and orientation for patient age and normal voice with no focal findings or movement disorder noted  Musculoskeletal - no joint tenderness, deformity or swelling  Extremities - peripheral pulses normal, no pedal edema, no clubbing or cyanosis  Skin - normal coloration and turgor, no rashes, no suspicious skin lesions noted  Intake and Output Summary (Last 24 hours) at Date Time    Intake/Output Summary (Last 24 hours) at 09/30/14  1828  Last data filed at 09/30/14 1227   Gross per 24 hour   Intake   2146 ml   Output   2175 ml   Net    -29 ml         Labs Reviewed:   Recent CBC WITH DIFF No results for input(s): RBC, HGB, HCT, MCV, MCHC, RDW, MPV, LABPLAT in the last 24 hours.    Invalid input(s): WHITEBLOODCE, ADIFF, REFLX, CANCL, BAND, ABAND    Rads:   Radiological Procedure reviewed.   Radiology Results (24 Hour)     Procedure Component Value Units Date/Time    CT Soft Tissue Neck with Contrast [[id number]] Collected:  09/29/14 1931    Order Status:  Completed Updated:  09/29/14 1940    Narrative:      Clinical history: Sore throat, difficulty swallowing    Findings: Contrast-enhanced CT neck soft tissues was performed.  Intravenous Omnipaque 350, 100 cc was utilized. There are no prior  studies.    There is enlargement of the bilateral palatine tonsils, especially on  the right. There is a multilobulated low-density peripherally enhancing  lesion in the right palatine tonsil. It measures 1.9 x 2.5 x 1.5 cm  (height x AP x width). This is felt to represent an abscess. Enlarged  lymph nodes are present in the bilateral upper internal jugular chain  nodal stations, especially on the right.    There is mild prominence of the adenoids and lingual tonsils. There is  no neoplasm of the aerodigestive tract. The bilateral parotid and  submandibular glands are normal. The thyroid gland is in normal  location.      Impression:       There is a right peritonsillar abscess present.    Results given via telephone to Dr. Dory Peru on 09/29/2014 at 7:36 PM    Trilby Drummer, MD   09/29/2014 7:36 PM      US Abdomen Complete [[id number]] Collected:  09/29/14 1830    Order Status:  Completed Updated:  09/29/14 1835    Narrative:      CLINICAL INFORMATION: 19 year old. Severe sore throat. Left upper  quadrant pain.    TECHNIQUE AND FINDINGS: Abdominal ultrasound. No comparisons.    GALLBLADDER: Normal position. Normal size. No gallstones, gallbladder  wall thickening,  direct gallbladder tenderness, or pericholecystic  abnormality.    BILE DUCTS: No intrahepatic dilatation. Partially obscured common duct.  Visualized common duct within normal limits. Proximal common duct  measures 4 mm.      LIVER: Normal size and configuration. No surface or parenchymal  nodularity. Echogenicity within  normal limits. Visualized intrahepatic  vasculature appears normal. No focal liver lesion identified.    SPLEEN: Normal size and echogenicity. No significant intrasplenic or  perisplenic abnormality demonstrated.    PANCREAS: Partially obscured. No abnormality demonstrated.    AORTA & IVC: Partially obscured. No significant abnormality  identified.    KIDNEYS: Limited survey imaging. No evidence of obstruction. Within  normal limits in length.    PERITONEUM & OTHER: No ascites.      Impression:       No splenomegaly or other significant upper abdominal  abnormality.    Annabell Sabal, MD   09/29/2014 6:31 PM             Assessment:  Small right Peritonsillar abscess versus phlegmon  Dehydration  Pain    Plan:  The pain is markedly improved after less than 24 hours of IV antibiotic and steroid with a physical that does not show marked asymmetry in the tonsil. Small PTA's can be treated with high dose steroids and oral antibiotics which is preferred over a surgical procedure. I will place him on 10 days of oral clindamycin and prednisone 40 mg q day for three days then 20 q day for two days. He should have a follow up in my office on Friday and if he fails this treatment then a office based I/D can be done. I discussed this with the family as well as returning to the ED if there is a acute change for the worse with pain, fever, or breathing. Mom agrees to the plan.     Signed by: Cheral Almas

## 2014-09-30 NOTE — Discharge Instructions (Signed)
Diagnosis:   1. Right tonsillar phlegmon/ abscess/ tonsillitis    Instructions:   1. augmentin ES (600mg / 5ml) 7.34ml twice a day for 10 days  2. orapred 14 ml once a day for 3 days then 7ml once a day for 2 days (start tomorrow evening)  2. Ibuprofen 400-600 mg every 6hours as needed for pain or fever (take with solids)  3. hycet 10ml every 6 hours as needed for severe pain (take 4 hours apart from tyelnol)  4. Tylenol 640-650mg  every 4 hours as needed for pain or fever (take 4 hours apart from hycet)  5. May use cool mist humidifier  6. May gargle with warm salt water every 4 hours as needed for pain  7. Start with soft diet, as you are improving then advance to regular diet  8. Drink plenty of liquids.     Please call ENT office or your pediatrician or return to emergency room if he has persistent worsening of pain not controlled by medications presribed, he is unable to swallow liquids, he has trouble breathing, fever (temperature greater then 100.4) persists beyond 48 more hours, the area seems to be getting much more swollen,  or any other concerns.     Follow up:   1. Dr. Edward Jolly or associate in 5-7 days, sooner with any concerns.

## 2014-10-03 ENCOUNTER — Inpatient Hospital Stay: Payer: Medicaid Other | Admitting: Pediatrics

## 2014-10-15 ENCOUNTER — Inpatient Hospital Stay: Payer: BLUE CROSS/BLUE SHIELD | Admitting: Pediatrics

## 2014-10-15 ENCOUNTER — Inpatient Hospital Stay
Admission: EM | Admit: 2014-10-15 | Discharge: 2014-10-18 | DRG: 134 | Disposition: A | Discharge status: Home / Self Care | Payer: BLUE CROSS/BLUE SHIELD | Attending: Pediatrics | Admitting: Pediatrics

## 2014-10-15 DIAGNOSIS — J45909 Unspecified asthma, uncomplicated: Secondary | ICD-10-CM | POA: Diagnosis present

## 2014-10-15 DIAGNOSIS — R630 Anorexia: Secondary | ICD-10-CM

## 2014-10-15 DIAGNOSIS — J029 Acute pharyngitis, unspecified: Secondary | ICD-10-CM

## 2014-10-15 DIAGNOSIS — Z7952 Long term (current) use of systemic steroids: Secondary | ICD-10-CM

## 2014-10-15 DIAGNOSIS — J039 Acute tonsillitis, unspecified: Secondary | ICD-10-CM | POA: Diagnosis present

## 2014-10-15 DIAGNOSIS — J36 Peritonsillar abscess: Principal | ICD-10-CM | POA: Diagnosis present

## 2014-10-15 DIAGNOSIS — Z79899 Other long term (current) drug therapy: Secondary | ICD-10-CM

## 2014-10-15 LAB — CELL MORPHOLOGY
Cell Morphology: NORMAL
Platelet Estimate: NORMAL

## 2014-10-15 LAB — COMPREHENSIVE METABOLIC PANEL
ALT: 47 U/L (ref 0–55)
AST (SGOT): 27 U/L (ref 5–34)
Albumin/Globulin Ratio: 1.4 (ref 0.9–2.2)
Albumin: 4.3 g/dL (ref 3.5–5.0)
Alkaline Phosphatase: 76 U/L (ref 65–260)
Anion Gap: 11 (ref 5.0–15.0)
BUN: 16.1 mg/dL (ref 9.0–28.0)
Bilirubin, Total: 0.5 mg/dL (ref 0.2–1.2)
CO2: 23 mEq/L (ref 22–29)
Calcium: 10 mg/dL (ref 8.5–10.5)
Chloride: 103 mEq/L (ref 100–111)
Creatinine: 1.1 mg/dL (ref 0.7–1.3)
Globulin: 3.1 g/dL (ref 2.0–3.6)
Glucose: 91 mg/dL (ref 70–100)
Potassium: 4.5 mEq/L (ref 3.5–5.1)
Protein, Total: 7.4 g/dL (ref 6.0–8.3)
Sodium: 137 mEq/L (ref 136–145)

## 2014-10-15 LAB — CBC AND DIFFERENTIAL
Basophils Absolute Automated: 0.06 10*3/uL (ref 0.00–0.20)
Basophils Automated: 0 %
Eosinophils Absolute Automated: 0.04 10*3/uL (ref 0.00–0.70)
Eosinophils Automated: 0 %
Hematocrit: 35.3 % — ABNORMAL LOW (ref 42.0–52.0)
Hgb: 12.1 g/dL — ABNORMAL LOW (ref 13.0–17.0)
Immature Granulocytes Absolute: 0.15 10*3/uL — ABNORMAL HIGH
Immature Granulocytes: 1 %
Lymphocytes Absolute Automated: 1.54 10*3/uL (ref 0.50–4.40)
Lymphocytes Automated: 6 %
MCH: 31.1 pg (ref 28.0–32.0)
MCHC: 34.3 g/dL (ref 32.0–36.0)
MCV: 90.7 fL (ref 80.0–100.0)
MPV: 10.8 fL (ref 9.4–12.3)
Monocytes Absolute Automated: 2.03 10*3/uL — ABNORMAL HIGH (ref 0.00–1.20)
Monocytes: 8 %
Neutrophils Absolute: 21.78 10*3/uL — ABNORMAL HIGH (ref 1.80–8.10)
Neutrophils: 86 %
Platelets: 267 10*3/uL (ref 140–400)
RBC: 3.89 10*6/uL — ABNORMAL LOW (ref 4.70–6.00)
RDW: 14 % (ref 12–15)
WBC: 25.45 10*3/uL — ABNORMAL HIGH (ref 3.50–10.80)

## 2014-10-15 LAB — C-REACTIVE PROTEIN: C-Reactive Protein: 2.6 mg/dL — ABNORMAL HIGH (ref 0.0–0.8)

## 2014-10-15 MED ORDER — SODIUM CHLORIDE 0.9 % IV MBP
2.0000 g | INTRAVENOUS | Status: DC
Start: 2014-10-16 — End: 2014-10-18
  Administered 2014-10-16 – 2014-10-18 (×3): 2 g via INTRAVENOUS
  Filled 2014-10-15 (×3): qty 2000

## 2014-10-15 MED ORDER — CLINDAMYCIN PHOSPHATE IN D5W 900 MG/50ML IV SOLN
900.0000 mg | Freq: Three times a day (TID) | INTRAVENOUS | Status: DC
Start: 2014-10-15 — End: 2014-10-18
  Administered 2014-10-15 – 2014-10-18 (×8): 900 mg via INTRAVENOUS
  Filled 2014-10-15 (×10): qty 50

## 2014-10-15 MED ORDER — DEXTROSE-SODIUM CHLORIDE 5-0.45 % IV SOLN
Freq: Once | INTRAVENOUS | Status: AC
Start: 2014-10-15 — End: 2014-10-15
  Administered 2014-10-15: 125 mL/h via INTRAVENOUS

## 2014-10-15 MED ORDER — DEXTROSE-SODIUM CHLORIDE 5-0.45 % IV SOLN
INTRAVENOUS | Status: DC
Start: 2014-10-15 — End: 2014-10-18

## 2014-10-15 MED ORDER — CLINDAMYCIN PHOSPHATE IN D5W 600 MG/50ML IV SOLN
600.0000 mg | Freq: Once | INTRAVENOUS | Status: AC
Start: 2014-10-15 — End: 2014-10-15
  Administered 2014-10-15: 600 mg via INTRAVENOUS
  Filled 2014-10-15: qty 50

## 2014-10-15 MED ORDER — ONDANSETRON HCL 4 MG/2ML IJ SOLN
4.0000 mg | Freq: Once | INTRAMUSCULAR | Status: AC
Start: 2014-10-15 — End: 2014-10-15
  Administered 2014-10-15: 4 mg via INTRAVENOUS
  Filled 2014-10-15: qty 2

## 2014-10-15 MED ORDER — METHYLPREDNISOLONE SODIUM SUCC 125 MG IJ SOLR
125.0000 mg | Freq: Once | INTRAMUSCULAR | Status: AC
Start: 2014-10-15 — End: 2014-10-15
  Administered 2014-10-15: 125 mg via INTRAVENOUS
  Filled 2014-10-15: qty 2

## 2014-10-15 MED ORDER — SODIUM CHLORIDE 0.9 % IV MBP
1.0000 g | Freq: Once | INTRAVENOUS | Status: AC
Start: 2014-10-15 — End: 2014-10-15
  Administered 2014-10-15: 1 g via INTRAVENOUS
  Filled 2014-10-15: qty 1000
  Filled 2014-10-15: qty 100

## 2014-10-15 MED ORDER — SODIUM CHLORIDE 0.9 % IV BOLUS
20.0000 mL/kg | Freq: Once | INTRAVENOUS | Status: AC
Start: 2014-10-15 — End: 2014-10-15
  Administered 2014-10-15: 1512 mL via INTRAVENOUS

## 2014-10-15 MED ORDER — SODIUM CHLORIDE 0.9 % IV MBP
1.0000 g | Freq: Once | INTRAVENOUS | Status: AC
Start: 2014-10-15 — End: 2014-10-15
  Administered 2014-10-15: 1 g via INTRAVENOUS
  Filled 2014-10-15: qty 1000

## 2014-10-15 MED ORDER — HYDROCODONE-ACETAMINOPHEN 5-325 MG PO TABS
1.0000 | ORAL_TABLET | Freq: Four times a day (QID) | ORAL | Status: DC | PRN
Start: 2014-10-15 — End: 2014-10-15
  Filled 2014-10-15: qty 1

## 2014-10-15 MED ORDER — MORPHINE SULFATE 2 MG/ML IJ/IV SOLN (WRAP)
2.0000 mg | Status: DC | PRN
Start: 2014-10-15 — End: 2014-10-18
  Administered 2014-10-16: 2 mg via INTRAVENOUS
  Filled 2014-10-15: qty 1

## 2014-10-15 MED ORDER — SODIUM CHLORIDE 0.9 % IV MBP
2.0000 g | INTRAVENOUS | Status: DC
Start: 2014-10-16 — End: 2014-10-15

## 2014-10-15 MED ORDER — KETOROLAC TROMETHAMINE 30 MG/ML IJ SOLN
30.0000 mg | Freq: Once | INTRAMUSCULAR | Status: AC
Start: 2014-10-15 — End: 2014-10-15
  Administered 2014-10-15: 30 mg via INTRAVENOUS
  Filled 2014-10-15: qty 1

## 2014-10-15 MED ORDER — KETOROLAC TROMETHAMINE 30 MG/ML IJ SOLN
15.0000 mg | Freq: Four times a day (QID) | INTRAMUSCULAR | Status: DC | PRN
Start: 2014-10-15 — End: 2014-10-16
  Administered 2014-10-15 – 2014-10-16 (×2): 15 mg via INTRAVENOUS
  Filled 2014-10-15 (×2): qty 1

## 2014-10-15 MED ORDER — HYDROCODONE-ACETAMINOPHEN 7.5-325 MG/15ML PO SOLN
10.0000 mL | ORAL | Status: DC | PRN
Start: 2014-10-15 — End: 2014-10-16
  Administered 2014-10-15 – 2014-10-16 (×2): 10 mL via ORAL
  Filled 2014-10-15 (×2): qty 15

## 2014-10-15 MED ORDER — FENTANYL CITRATE 0.05 MG/ML IJ SOLN
1.0000 ug/kg | Freq: Once | INTRAMUSCULAR | Status: AC
Start: 2014-10-15 — End: 2014-10-15
  Administered 2014-10-15: 75.5 ug via INTRAVENOUS
  Filled 2014-10-15: qty 2

## 2014-10-15 MED ORDER — ACETAMINOPHEN 160 MG/5ML PO SUSP
640.0000 mg | ORAL | Status: DC | PRN
Start: 2014-10-15 — End: 2014-10-18

## 2014-10-15 NOTE — ED Notes (Signed)
Was here two weeks ago and diagnosed with tonsillar abscess. Was on pain medicine and antibiotics for 2 weeks and pain went away.   Since Thursday, pain has been getting worse in throat, fever has returned.     As an aside, patient complains of sudden onset of chest pain for the past 2 months, sharp and bilateral and lasts 2-3 minutes

## 2014-10-15 NOTE — ED Provider Notes (Signed)
Physician/Midlevel provider first contact with patient: 10/15/14 0847         History     Chief Complaint   Patient presents with   . Sore Throat   . Abscess     HPI Comments: Patient is a 19 year old well known to me as I admitted him 10 days ago with a peritonsillar abscess on the right tonsil.  He was noted to the hospital and given IV anabiotic's and steroid-dependent and seemed to resolve and responded well.  He did not have an incision and drainage.  He has follow-up with ENT on Wednesday.  He completed 10 days of antibodies, etc., was doing extremely well 2 days ago and then the sore throat returned 2 days after that.  He is here with resumption of a sore throat.  On exam, his right tonsil is enlarged with exudate    Patient is a 19 y.o. male presenting with pharyngitis and abscess. The history is provided by the patient and a relative. No language interpreter was used.   Sore Throat  This is a recurrent problem. The current episode started 1 to 4 weeks ago. The problem has been unchanged. Associated symptoms include abdominal pain, anorexia, congestion, fatigue, nausea, neck pain, a sore throat, swollen glands and weakness. Pertinent negatives include no arthralgias, change in bowel habit, chest pain, chills, coughing, diaphoresis, fever, headaches, joint swelling, myalgias, numbness, rash, urinary symptoms, vertigo, visual change or vomiting. Associated symptoms comments: Mild abdominal pain. The symptoms are aggravated by eating. The treatment provided no relief.   Abscess  Associated symptoms: anorexia, fatigue and nausea    Associated symptoms: no fever, no headaches and no vomiting             History reviewed. No pertinent past medical history.    History reviewed. No pertinent past surgical history.    History reviewed. No pertinent family history.    Social  History   Substance Use Topics   . Smoking status: Never Smoker    . Smokeless tobacco: Not on file   . Alcohol Use: No       .     No Known  Allergies    Home Medications     Last Medication Reconciliation Action:  Complete Aloha Gell, RN 10/15/2014  8:53 AM                  acetaminophen (TYLENOL) 160 MG chewable tablet     Chew 4 tablets (640 mg total) by mouth every 4 (four) hours as needed for Pain or Fever.     amoxicillin-clavulanate (AUGMENTIN ES-600) 600-42.9 MG/5ML suspension     Take 7.5 mLs (900 mg total) by mouth 2 (two) times daily. For 10 days     HYDROcodone-acetaminophen (HYCET) 7.5-325 MG/15ML solution     Take 10 mLs by mouth every 6 (six) hours as needed (severe pain).     ibuprofen (ADVIL,MOTRIN) 100 MG chewable tablet     Chew 4-6 tablets (400-600 mg total) by mouth every 6 (six) hours as needed for Pain or Fever.     prednisoLONE (ORAPRED) 15 MG/5ML solution     14ml x 3 days once daily , then 7ml once daily  for 2 days then stop           Review of Systems   Constitutional: Positive for appetite change and fatigue. Negative for fever, chills, diaphoresis, activity change and unexpected weight change.   HENT: Positive for congestion, rhinorrhea, sinus pressure, sore  throat, trouble swallowing and voice change. Negative for ear discharge, ear pain, nosebleeds and postnasal drip.    Eyes: Negative for photophobia, pain, discharge, redness and itching.   Respiratory: Negative for cough, choking, chest tightness, shortness of breath, wheezing and stridor.    Cardiovascular: Negative for chest pain and palpitations.   Gastrointestinal: Positive for nausea, abdominal pain and anorexia. Negative for vomiting, diarrhea, constipation, abdominal distention and change in bowel habit.   Genitourinary: Negative for frequency, hematuria, flank pain, decreased urine volume, enuresis and difficulty urinating.   Musculoskeletal: Positive for neck pain. Negative for myalgias, back pain, joint swelling, arthralgias, gait problem and neck stiffness.   Skin: Negative for color change, pallor and rash.   Allergic/Immunologic: Negative for  environmental allergies, food allergies and immunocompromised state.   Neurological: Positive for weakness. Negative for dizziness, vertigo, seizures, syncope, light-headedness, numbness and headaches.   Hematological: Negative for adenopathy. Does not bruise/bleed easily.   Psychiatric/Behavioral: Negative for behavioral problems and agitation. The patient is not nervous/anxious.        Physical Exam    BP: 121/63 mmHg, Heart Rate: 99, Temp: 100.1 F (37.8 C), Resp Rate: 15, SpO2: 100 %, Weight: 75.6 kg    Physical Exam   Constitutional: He appears well-developed and well-nourished. He appears distressed.   HENT:   Head: Normocephalic and atraumatic.   Right Ear: External ear normal.   Left Ear: External ear normal.   Mouth/Throat: Mucous membranes are not pale, dry and not cyanotic. Posterior oropharyngeal edema and posterior oropharyngeal erythema present. No oropharyngeal exudate.   Right tonsil is definitely larger than the left.  Looking similar to previous admission.  No airway compromise   Eyes: Conjunctivae are normal. Pupils are equal, round, and reactive to light. Right eye exhibits no discharge. No scleral icterus.   Neck: Normal range of motion. No tracheal deviation present. No thyromegaly present.   Bilateral mild anterior cervical adenopathy   Cardiovascular: Normal rate, regular rhythm, normal heart sounds and intact distal pulses.  Exam reveals no gallop and no friction rub.    No murmur heard.  Pulmonary/Chest: Effort normal. No respiratory distress. He has no wheezes. He has no rales. He exhibits no tenderness.   Abdominal: Soft. He exhibits no distension and no mass. There is no tenderness. There is no rebound and no guarding.   No hepatosplenomegaly   Musculoskeletal: Normal range of motion. He exhibits no edema or tenderness.   Lymphadenopathy:     He has no cervical adenopathy.   Neurological: He is alert. He displays normal reflexes. No cranial nerve deficit. He exhibits normal muscle tone.  Coordination normal.   Skin: Skin is warm. No rash noted. He is not diaphoretic. No erythema. No pallor.   Psychiatric: He has a normal mood and affect. His behavior is normal. Thought content normal.   Nursing note and vitals reviewed.        MDM and ED Course     ED Medication Orders     Start Ordered     Status Ordering Provider    10/15/14 1135 10/15/14 1134  ketorolac (TORADOL) injection 30 mg   Once     Route: Intravenous  Ordered Dose: 30 mg     Last MAR action:  Given Caily Rakers D    10/15/14 0858 10/15/14 0857  ondansetron (ZOFRAN) injection 4 mg   Once     Route: Intravenous  Ordered Dose: 4 mg     Last MAR action:  Given Aasir Daigler,  Andrej Spagnoli D    10/15/14 0858 10/15/14 0857  methylPREDNISolone sodium succinate (Solu-MEDROL) injection 125 mg   Once     Route: Intravenous  Ordered Dose: 125 mg     Last MAR action:  Given Salem Mastrogiovanni D    10/15/14 0858 10/15/14 0857  clindamycin (CLEOCIN) 600mg  in D5W 50mL IVPB (premix)   Once     Route: Intravenous  Ordered Dose: 600 mg     Last MAR action:  Tery Sanfilippo D    10/15/14 0858 10/15/14 0857  cefTRIAXone (ROCEPHIN) 1 g in sodium chloride 0.9 % 100 mL IVPB mini-bag plus   Once     Route: Intravenous  Ordered Dose: 1 g     Last MAR action:  Stopped Sharyne Richters D    10/15/14 0857 10/15/14 0856  dextrose  5 % and 0.45 % NaCl infusion   Once     Route: Intravenous     Last MAR action:  Cathlyn Parsons D    10/15/14 0857 10/15/14 0856  sodium chloride 0.9 % bolus 1,512 mL   Once     Route: Intravenous  Ordered Dose: 20 mL/kg     Last MAR action:  Tery Sanfilippo D    10/15/14 0857 10/15/14 0856  fentaNYL (SUBLIMAZE) injection 75.5 mcg   Once     Route: Intravenous  Ordered Dose: 1 mcg/kg     Last MAR action:  Given Anyela Napierkowski D              MDM  Number of Diagnoses or Management Options  Peritonsillar abscess:   Pharyngitis:   Diagnosis management comments: 9:07 AM  SaO2 97-99%  normal    Additional Social History  The patient  lives with family, goes to school, and does not smoke cigarettes or is not exposed to cigarette smoke.    Phillis Knack. Amelita Risinger MD, have reviewed old medical records if available and needed including previous emergency department visits, hospital admissions, laboratories, radiologic studies, EKG's, and previous treatment plans in the care of this patient.    Nurse's notes, vital signs, and past medical history as well as social history reviewed at length    Differential includes abscess, cellulitis, pharyngitis    We will do IV antibiotic sterilized pain management and labs.  We will to have follow-up with ENT and not readmitted to the hospital and not reimage the neck with a CT    12:19 PM  Patient still with significant throat pain, unable to swallow pills at the time of my reevaluation.  We will consider putting in the hospital antibiosis sterilize a reevaluation by ENT    1:57 PM  Plan is to admit for IV antibiotic's overnight, pain management.  Hopefully discharge on oral medications tomorrow.  Concern for much elevated white blood cell count compared to previous admission.         Amount and/or Complexity of Data Reviewed  Clinical lab tests: reviewed  Tests in the radiology section of CPT: reviewed    Risk of Complications, Morbidity, and/or Mortality  Presenting problems: moderate  Diagnostic procedures: moderate  Management options: moderate    Patient Progress  Patient progress: stable         Procedures  Results     Procedure Component Value Units Date/Time    CBC and differential  [[id number]]  (Abnormal) Collected:  10/16/14 0523    Specimen Information:  Blood / Blood Updated:  10/16/14 4098  WBC 22.10 (H) x10 3/uL      Hgb 10.7 (L) g/dL      Hematocrit 96.0 (L) %      Platelets 245 x10 3/uL      RBC 3.45 (L) x10 6/uL      MCV 91.3 fL      MCH 31.0 pg      MCHC 34.0 g/dL      RDW 14 %      MPV 10.4 fL     Cell MorpHology [[id number]] Collected:  10/15/14 0910     Cell Morphology: Normal Updated:   10/15/14 0946     Platelet Estimate Normal     CBC with differential [[id number]]  (Abnormal) Collected:  10/15/14 0910    Specimen Information:  Blood / Blood Updated:  10/15/14 0946     WBC 25.45 (H) x10 3/uL      Hgb 12.1 (L) g/dL      Hematocrit 13.0 (L) %      Platelets 267 x10 3/uL      RBC 3.89 (L) x10 6/uL      MCV 90.7 fL      MCH 31.1 pg      MCHC 34.3 g/dL      RDW 14 %      MPV 10.8 fL      Neutrophils 86 %      Lymphocytes Automated 6 %      Monocytes 8 %      Eosinophils Automated 0 %      Basophils Automated 0 %      Immature Granulocyte 1 %      Neutrophils Absolute 21.78 (H) x10 3/uL      Abs Lymph Automated 1.54 x10 3/uL      Abs Mono Automated 2.03 (H) x10 3/uL      Abs Eos Automated 0.04 x10 3/uL      Absolute Baso Automated 0.06 x10 3/uL      Absolute Immature Granulocyte 0.15 (H) x10 3/uL     C Reactive Protein [[id number]]  (Abnormal) Collected:  10/15/14 0910    Specimen Information:  Blood Updated:  10/15/14 0941     C-Reactive Protein 2.6 (H) mg/dL     Comprehensive metabolic panel [[id number]] Collected:  10/15/14 0910    Specimen Information:  Blood Updated:  10/15/14 0941     Glucose 91 mg/dL      BUN 44.0 mg/dL      Creatinine 1.1 mg/dL      Sodium 102 mEq/L      Potassium 4.5 mEq/L      Chloride 103 mEq/L      CO2 23 mEq/L      CALCIUM 10.0 mg/dL      Protein, Total 7.4 g/dL      Albumin 4.3 g/dL      AST (SGOT) 27 U/L      ALT 47 U/L      Alkaline Phosphatase 76 U/L      Bilirubin, Total 0.5 mg/dL      Globulin 3.1 g/dL      Albumin/Globulin Ratio 1.4      Anion Gap 11.0         Clinical Impression & Disposition       Clinical Impression  Final diagnoses:   Pharyngitis   Peritonsillar abscess        ED Disposition     Admit Bed Type: General [8]  Admitting Physician: [facility] [[id number]]  Patient Class: Inpatient [  101]             Current Discharge Medication List                      Ames Coupe, MD  10/16/14 [phone]

## 2014-10-15 NOTE — H&P (Signed)
PEDIATRIC ADMISSION HISTORY AND PHYSICAL EXAM    Admit Date and Time:  10/15/2014  8:42 AM   Today's Date and Time: 10/15/14  Patient Name: Barry Gross  Attending Physician: Gertie Fey, MD    Active Hospital Problems    Diagnosis   . Tonsillitis         Assessment:   19 y.o. male with tonsillitis and R> L tonsil likely tonsillar abscess. I discussed case with Dr. Caryn Section (his ent group) and the ent on call for their group. At this time tonsill is large, no uvular deviation.   Will hold on reimage today.  Dr. Baldwin Jamaica recommends IV abx with broader coverage.   clinda and rocephin started  Daily cbc and watch to see it improves.   If not may need reimage and/or drainage.       Er course:  Temp 100.1, pulse 99, O2 sat 100% on rA, rr 15, bp 121/63  Per er noted on exam right tonsil larger then left.   Wbc 25.45, 86% neut, bicarb 23, crp 2.6, BUN 16.1.     3/17 CT of soft tissue neck showed  There is a multilobulated low-density peripherally enhancing  lesion in the right palatine tonsil. It measures 1.9 x 2.5 x 1.5 cm  (height x AP x width). This is felt to represent an abscess.     Pt given NS bolus of fluids, fentanyl, zofran solumedrol 125mg , clinda, rocephin and toradol.   Admitted for IV abx and watch to see if it will need drainage.   Plan:   Neuro:   1. toradol prn  2. Morphine prn  3. noroc  Resp: stable  CV: stable  FEN/ GI:   1. ivf  2. Soft diet  ID: pt was on augmentin  1. clinda  2. Add rocephin  3. ENT aware - Dr. Baldwin Jamaica on call for Dr. Allegra Lai today. Both Dr. Baldwin Jamaica and Baldwin Jamaica are aware of patient  .  Updated mom and patient with care plan  History of Presenting Illness:   Barry Gross is a 19 y.o. male who presents to the hospital with right tonsill swollen and painful.   On 3/14 pt had sore throat, next day pt seen by pmd and strep was negative so given magic mouth wash.   Pt wcont with throat pain and body ache with fever. Seen by urgent care who prescribed augmentin/ prednisone.    However pain worsening with right sided jaw pain so seen in our er on 3/17.     3/17. He was seen in our er. He had wbc of 14 and CT scan of soft tissue neck c/w a tonsillitiis with tonsillar abscess vs phlegmon.   Pt given decadron and clinda IV. He was admitted. He was seen by ENT on 3/18 on the floor, Dr. Edward Jolly who examined the paitent and reviewed the CT. Pt clinically had felt better . Decision made to not drain abscess and see if it would resolve with oral augmenting and steroid taper. Pt sent home on 10 days of oral augmentin and prednisone taper over 5 days. To also follow up with ENT in 5-7 days in the office, sooner with concerns.   Pt followed up with Dr. Caryn Section on 3/23. Pt feeling okay.  Pt improved.   He completed abx, 4 days ago - 3/29 (tuesday). He was doing okay  2 days PTA he had left tonsillar pain. Ibuprofen helped  Yesterday when he woke up he had b/l pain and  as the day went on he had trouble swallowing but still managed to eat and drink.   He had fever to 100.6 and body aches.   His right side started hurting more then the left.   He took ibuprofen last night and was able to sleep.   This am he hd R>L  Pain that was much worse so came to er around 8:30am.     Review of Systems:   No trismus  No trouble breathing    Birth History/Growth & Development     FT, no complications    Past Medical History:     Spring allergies - has not come yet, usually in April with stuffy nose , runny nose, and sneezing.   Asthma really mild.   Hospitalizations:     3/17-3/18/16 for tonsillar abscess on right  Past Surgical History:   History reviewed. No pertinent past surgical history.  none  Social History:   Lives with mom , 15yo brother.   One friend sick with sore throat around same time, he has improved. dont think he got antibiotic.   [employer] 1st year    Allergies:   No Known Allergies    Medications:     Prior to Admission medications    Medication Sig Start Date End Date Taking? Authorizing Provider    ibuprofen (ADVIL,MOTRIN) 100 MG chewable tablet Chew 4-6 tablets (400-600 mg total) by mouth every 6 (six) hours as needed for Pain or Fever. 09/30/14  Yes Gertie Fey, MD   acetaminophen (TYLENOL) 160 MG chewable tablet Chew 4 tablets (640 mg total) by mouth every 4 (four) hours as needed for Pain or Fever. 09/30/14   Gertie Fey, MD   amoxicillin-clavulanate (AUGMENTIN ES-600) 600-42.9 MG/5ML suspension Take 7.5 mLs (900 mg total) by mouth 2 (two) times daily. For 10 days 09/30/14   Gertie Fey, MD   HYDROcodone-acetaminophen (HYCET) 7.5-325 MG/15ML solution Take 10 mLs by mouth every 6 (six) hours as needed (severe pain). 09/30/14   Gertie Fey, MD   prednisoLONE (ORAPRED) 15 MG/5ML solution 14ml x 3 days once daily , then 7ml once daily  for 2 days then stop 09/30/14   Gertie Fey, MD         Immunizations:     utd  Physical Exam:   Temp: 100.1 F (37.8 C)  Heart Rate: 99  Resp Rate: 15  BP: 121/63 mmHg  SpO2: 100 %  Weight: 75.6 kg (166 lb 10.7 oz)   Filed Vitals:    10/15/14 1300 10/15/14 1330 10/15/14 1338 10/15/14 1442   BP:  104/51 104/51 114/58   Pulse: 90 88 89 86   Temp:   98.1 F (36.7 C) 97.4 F (36.3 C)   TempSrc:    Oral   Resp:   16 18   Height:    1.88 m (6\' 2" )   Weight:    76 kg (167 lb 8.8 oz)   SpO2: 98% 97% 98% 100%       Gen: NAD, no trismus, non toxic  HEENT:  nml Tm b/l, right tonsil and left tonsil exuative. Right tonsil muh larger then left tonsil. Uvula midline, no hotpotato voice, no trismus  Neck: supple, no pain on palpation, FrOM of neck, no pain with movement.   Chest: CTA B  Heart:  S1S2 rrr, no murmur  Abdomen: soft, non tender, non distended, no hsm  Neuro: grossly intact  Skin: nml  Musculoskeletal: moves all 4 ext  well        Labs:     Results     Procedure Component Value Units Date/Time    Cell MorpHology [[id number]] Collected:  10/15/14 0910     Cell Morphology: Normal Updated:  10/15/14 0946     Platelet Estimate Normal     CBC with  differential [[id number]]  (Abnormal) Collected:  10/15/14 0910    Specimen Information:  Blood / Blood Updated:  10/15/14 0946     WBC 25.45 (H) x10 3/uL      Hgb 12.1 (L) g/dL      Hematocrit 32.4 (L) %      Platelets 267 x10 3/uL      RBC 3.89 (L) x10 6/uL      MCV 90.7 fL      MCH 31.1 pg      MCHC 34.3 g/dL      RDW 14 %      MPV 10.8 fL      Neutrophils 86 %      Lymphocytes Automated 6 %      Monocytes 8 %      Eosinophils Automated 0 %      Basophils Automated 0 %      Immature Granulocyte 1 %      Neutrophils Absolute 21.78 (H) x10 3/uL      Abs Lymph Automated 1.54 x10 3/uL      Abs Mono Automated 2.03 (H) x10 3/uL      Abs Eos Automated 0.04 x10 3/uL      Absolute Baso Automated 0.06 x10 3/uL      Absolute Immature Granulocyte 0.15 (H) x10 3/uL     C Reactive Protein [[id number]]  (Abnormal) Collected:  10/15/14 0910    Specimen Information:  Blood Updated:  10/15/14 0941     C-Reactive Protein 2.6 (H) mg/dL     Comprehensive metabolic panel [[id number]] Collected:  10/15/14 0910    Specimen Information:  Blood Updated:  10/15/14 0941     Glucose 91 mg/dL      BUN 25.9 mg/dL      Creatinine 1.1 mg/dL      Sodium 563 mEq/L      Potassium 4.5 mEq/L      Chloride 103 mEq/L      CO2 23 mEq/L      CALCIUM 10.0 mg/dL      Protein, Total 7.4 g/dL      Albumin 4.3 g/dL      AST (SGOT) 27 U/L      ALT 47 U/L      Alkaline Phosphatase 76 U/L      Bilirubin, Total 0.5 mg/dL      Globulin 3.1 g/dL      Albumin/Globulin Ratio 1.4      Anion Gap 11.0           Rads:   Ct Soft Tissue Neck With Contrast    09/29/2014   Clinical history: Sore throat, difficulty swallowing  Findings: Contrast-enhanced CT neck soft tissues was performed. Intravenous Omnipaque 350, 100 cc was utilized. There are no prior studies.  There is enlargement of the bilateral palatine tonsils, especially on the right. There is a multilobulated low-density peripherally enhancing lesion in the right palatine tonsil. It measures 1.9 x 2.5 x 1.5 cm (height  x AP x width). This is felt to represent an abscess. Enlarged lymph nodes are present in the bilateral upper internal jugular chain nodal stations, especially on the right.  There  is mild prominence of the adenoids and lingual tonsils. There is no neoplasm of the aerodigestive tract. The bilateral parotid and submandibular glands are normal. The thyroid gland is in normal location.     09/29/2014    There is a right peritonsillar abscess present.  Results given via telephone to Dr. Dory Peru on 09/29/2014 at 7:36 PM  Trilby Drummer, MD  09/29/2014 7:36 PM     US Abdomen Complete    09/29/2014   CLINICAL INFORMATION: 19 year old. Severe sore throat. Left upper quadrant pain.  TECHNIQUE AND FINDINGS: Abdominal ultrasound. No comparisons.  GALLBLADDER: Normal position. Normal size. No gallstones, gallbladder wall thickening, direct gallbladder tenderness, or pericholecystic abnormality.  BILE DUCTS: No intrahepatic dilatation. Partially obscured common duct. Visualized common duct within normal limits. Proximal common duct measures 4 mm.    LIVER: Normal size and configuration. No surface or parenchymal nodularity. Echogenicity within normal limits. Visualized intrahepatic vasculature appears normal. No focal liver lesion identified.  SPLEEN: Normal size and echogenicity. No significant intrasplenic or perisplenic abnormality demonstrated.  PANCREAS: Partially obscured. No abnormality demonstrated.  AORTA & IVC: Partially obscured. No significant abnormality identified.  KIDNEYS: Limited survey imaging. No evidence of obstruction. Within normal limits in length.  PERITONEUM & OTHER: No ascites.     09/29/2014    No splenomegaly or other significant upper abdominal abnormality.  Annabell Sabal, MD  09/29/2014 6:31 PM         Signed by: Gertie Fey

## 2014-10-15 NOTE — Consults (Signed)
CONSULTATION    Date Time: 10/15/2014 9:31 PM  Patient Name: Barry Gross, Barry Gross  MRN#: [id number]  DOB: 03-Jun-1996  Requesting Physician: Gertie Fey, MD      Reason for Consultation:   Rule out Peritonsillar abscess    Assessment:   Right acute tonsillitis.  No evidence of peritonsillar abscess    Plan:   Continue IV antibiotics, Steroid and Hydration    History:   Barry Gross is a 19 y.o. male who presents to the hospital on 10/15/2014 with severe throat pain.  Was seen one week ago and admitted for severe right tonsillitis, Ct revealed evidence 1.9cm hyperlucent region in area of right tonsil.  Patient was evaluated at the bedside by Dr. Edward Jolly and was found to not have peritonsil abscess.  Patient was discharged on Clindamycin.  He completed the antibiotic one day ago and this AM had severe Throat pain and inability to swallow.  He came in to ER and was admitted for IV meds and hydration    Past Medical History:   History reviewed. No pertinent past medical history.    Past Surgical History:   History reviewed. No pertinent past surgical history.    Family History:   History reviewed. No pertinent family history.    Social History:     History     Social History   . Marital Status: Single     Spouse Name: N/A   . Number of Children: N/A   . Years of Education: N/A     Social History Main Topics   . Smoking status: Never Smoker    . Smokeless tobacco: Not on file   . Alcohol Use: No   . Drug Use: No   . Sexual Activity: Not on file     Other Topics Concern   . Not on file     Social History Narrative       Allergies:   No Known Allergies    Medications:     Current Facility-Administered Medications   Medication Dose Route Frequency   . [START ON 10/16/2014] cefTRIAXone  2 g Intravenous Q24H SCH   . clindamycin  900 mg Intravenous Q8H SCH       Review of Systems:   A comprehensive review of systems was: ENT ROS: negative for - vocal changes    Physical Exam:   Patient is alert, oriented in NAD.  EATING  PIZZA  Communication - voice normal  Oral cavity - normal mucosa  Oropharynx - swollen, erythematous right tonsil without exudate. Uvula midline.  Soft palate/peritonsil soft tissue without swelling or evidence of abscess  Neck - no significant adenopathy    Filed Vitals:    10/15/14 2000   BP: 110/54   Pulse: 80   Temp: 98 F (36.7 C)   Resp: 18   SpO2: 99%       Intake and Output Summary (Last 24 hours) at Date Time    Intake/Output Summary (Last 24 hours) at 10/15/14 2131  Last data filed at 10/15/14 2000   Gross per 24 hour   Intake    833 ml   Output      0 ml   Net    833 ml       General appearance - alert, well appearing, and in no distress    Labs Reviewed:     Results     Procedure Component Value Units Date/Time    Cell MorpHology [[id number]] Collected:  10/15/14 0910  Cell Morphology: Normal Updated:  10/15/14 0946     Platelet Estimate Normal     CBC with differential [[id number]]  (Abnormal) Collected:  10/15/14 0910    Specimen Information:  Blood / Blood Updated:  10/15/14 0946     WBC 25.45 (H) x10 3/uL      Hgb 12.1 (L) g/dL      Hematocrit 40.9 (L) %      Platelets 267 x10 3/uL      RBC 3.89 (L) x10 6/uL      MCV 90.7 fL      MCH 31.1 pg      MCHC 34.3 g/dL      RDW 14 %      MPV 10.8 fL      Neutrophils 86 %      Lymphocytes Automated 6 %      Monocytes 8 %      Eosinophils Automated 0 %      Basophils Automated 0 %      Immature Granulocyte 1 %      Neutrophils Absolute 21.78 (H) x10 3/uL      Abs Lymph Automated 1.54 x10 3/uL      Abs Mono Automated 2.03 (H) x10 3/uL      Abs Eos Automated 0.04 x10 3/uL      Absolute Baso Automated 0.06 x10 3/uL      Absolute Immature Granulocyte 0.15 (H) x10 3/uL     C Reactive Protein [[id number]]  (Abnormal) Collected:  10/15/14 0910    Specimen Information:  Blood Updated:  10/15/14 0941     C-Reactive Protein 2.6 (H) mg/dL     Comprehensive metabolic panel [[id number]] Collected:  10/15/14 0910    Specimen Information:  Blood Updated:  10/15/14 0941      Glucose 91 mg/dL      BUN 57.8 mg/dL      Creatinine 1.1 mg/dL      Sodium 469 mEq/L      Potassium 4.5 mEq/L      Chloride 103 mEq/L      CO2 23 mEq/L      CALCIUM 10.0 mg/dL      Protein, Total 7.4 g/dL      Albumin 4.3 g/dL      AST (SGOT) 27 U/L      ALT 47 U/L      Alkaline Phosphatase 76 U/L      Bilirubin, Total 0.5 mg/dL      Globulin 3.1 g/dL      Albumin/Globulin Ratio 1.4      Anion Gap 11.0             Rads:   Radiological Procedure reviewed.  Reviewed last week CT- right tonsil swelling with small area of possible abscess/phlegmon deep to tonsil    Signed by: Londell Moh

## 2014-10-16 LAB — CBC AND DIFFERENTIAL
Hematocrit: 31.5 % — ABNORMAL LOW (ref 42.0–52.0)
Hgb: 10.7 g/dL — ABNORMAL LOW (ref 13.0–17.0)
MCH: 31 pg (ref 28.0–32.0)
MCHC: 34 g/dL (ref 32.0–36.0)
MCV: 91.3 fL (ref 80.0–100.0)
MPV: 10.4 fL (ref 9.4–12.3)
Platelets: 245 10*3/uL (ref 140–400)
RBC: 3.45 10*6/uL — ABNORMAL LOW (ref 4.70–6.00)
RDW: 14 % (ref 12–15)
WBC: 22.1 10*3/uL — ABNORMAL HIGH (ref 3.50–10.80)

## 2014-10-16 LAB — MAN DIFF ONLY
Band Neutrophils Absolute: 1.33 10*3/uL — ABNORMAL HIGH (ref 0.00–1.00)
Band Neutrophils: 6 %
Basophils Absolute Manual: 0 10*3/uL (ref 0.00–0.20)
Basophils Manual: 0 %
Eosinophils Absolute Manual: 0 10*3/uL (ref 0.00–0.70)
Eosinophils Manual: 0 %
Lymphocytes Absolute Manual: 1.33 10*3/uL (ref 0.50–4.40)
Lymphocytes Manual: 6 %
Monocytes Absolute: 1.55 10*3/uL — ABNORMAL HIGH (ref 0.00–1.20)
Monocytes Manual: 7 %
Neutrophils Absolute Manual: 17.9 10*3/uL — ABNORMAL HIGH (ref 1.80–8.10)
Nucleated RBC: 0 /100 WBC (ref 0–1)
Segmented Neutrophils: 81 %

## 2014-10-16 LAB — CELL MORPHOLOGY
Cell Morphology: NORMAL
Platelet Estimate: NORMAL

## 2014-10-16 MED ORDER — HYDROCODONE-ACETAMINOPHEN 7.5-325 MG/15ML PO SOLN
15.0000 mL | ORAL | Status: DC | PRN
Start: 2014-10-16 — End: 2014-10-18
  Administered 2014-10-16 – 2014-10-17 (×5): 15 mL via ORAL
  Filled 2014-10-16 (×5): qty 15

## 2014-10-16 MED ORDER — KETOROLAC TROMETHAMINE 30 MG/ML IJ SOLN
30.0000 mg | Freq: Four times a day (QID) | INTRAMUSCULAR | Status: DC | PRN
Start: 2014-10-16 — End: 2014-10-18
  Administered 2014-10-16 – 2014-10-17 (×3): 30 mg via INTRAVENOUS
  Filled 2014-10-16 (×3): qty 1

## 2014-10-16 NOTE — Progress Notes (Signed)
PEDS PROGRESS NOTE    Date Time: 10/16/2014 2:31 PM  Patient Name: Barry Gross, Barry Gross  19 y.o., male  Problem list:     Patient Active Problem List   Diagnosis   . Throat pain   . Phlegmonous tonsillitis on right   . Tonsillitis       Subjective:   Pt much the same.   Overnight the toradol and the norco do  Not seem to be helping much.  Today given morphine    Physical Exam:     Filed Vitals:    10/16/14 1320   BP: 125/73   Pulse:    Temp:    Resp:    SpO2: 100%     Gen: NAD, smiling  Heent: nml tm b/l, right tonsil red and swollen slightly larger then yesterday, touching uvula, no uvular deviation, no trismus, no muffled voice. Left tonsil improved, no exudate  Neck: FROM  Lungs: cta  Heart: s1s2 rrr  Abdomen: soft nt nd no hsm    Labs:   Results for Barry Gross, Barry Gross (MRN [id number]) as of 10/16/2014 14:31   Ref. Range 09/29/2014 17:35 10/15/2014 09:10 10/16/2014 05:23   WBC Latest Ref Range: 3.50-10.80 x10 3/uL 14.45 (H) 25.45 (H) 22.10 (H)   Hemoglobin Latest Ref Range: 13.0-17.0 g/dL 06.2 (L) 69.4 (L) 85.4 (L)   Hematocrit Latest Ref Range: 42.0-52.0 % 36.6 (L) 35.3 (L) 31.5 (L)   Platelet Count Latest Ref Range: 140-400 x10 3/uL 210 267 245   RBC Latest Ref Range: 4.70-6.00 x10 6/uL 4.11 (L) 3.89 (L) 3.45 (L)   MCV Latest Ref Range: 80.0-100.0 fL 89.1 90.7 91.3   MCH, POC Latest Ref Range: 28.0-32.0 pg 30.9 31.1 31.0   MCHC Latest Ref Range: 32.0-36.0 g/dL 62.7 03.5 00.9   RDW Latest Ref Range: 12-15 % 13 14 14    MPV Latest Ref Range: 9.4-12.3 fL 10.9 10.8 10.4   Neutrophils Latest Ref Range: None % 76 86    Lymphocytes Automated Latest Ref Range: None % 10 6    Monocytes Latest Ref Range: None % 14 8    Eosinophils Automated Latest Ref Range: None % 0 0    Basophils Automated Latest Ref Range: None % 0 0    Immature Granulocyte Latest Ref Range: None % 0 1        Rads:     Radiology Results (24 Hour)     ** No results found for the last 24 hours. **          Assessment:   18yo with tonsillitis with non improving right  tonsil. Do not suspect a peritonsillar abscess but cannot exclude a abscess within tonsil or soft tissue near the tonsil. Pt wbc slowly improving, but still elevated, pain not controlled on oral pain medications as it was before.   Will need to discuss case with his ENT, may need imaging vs taken to OR for drainage base on previous CT. Will discuss with ENT    Plan:   Neuro:   1. Tylenol prn fever  2. toradol prn pain  3. norco prn moderate pain  4. Morphine prn severe pain  Resp: stable  CV: stable  FEn/ gI:   1. IVF at maint  2. Soft diet  ID:   1. Cont medical management clindamycin and rocephin  2. Discuss case with ENT - Dr. Caryn Section - his ENT  3. Npo after MN for assessment tomorrow by ENT.       Signed by: Ginette Otto  Peaches Vanoverbeke

## 2014-10-16 NOTE — Plan of Care (Signed)
Problem: Inadequate Gas Exchange: Respiratory (Peds)  Goal: Child maintains adequate oxygenation/ventilation  Intervention: Assess respiratory rate, O2 sats, & work of breathing.  No respiratory distress. Oxygen saturation above 95%. Swallowing dinner well. Still has some discomfort when swallowing. Medicated with Hycet with some relief.

## 2014-10-16 NOTE — Plan of Care (Signed)
Problem: Pain  Goal: Patient's pain/discomfort is manageable  Outcome: Progressing  Patient resting in bed, states pain is under control at this time.  Patient voices understanding of pain control plan and agrees.

## 2014-10-17 ENCOUNTER — Encounter
Admission: EM | Disposition: A | Discharge status: Home / Self Care | Payer: Self-pay | Source: Home / Self Care | Attending: Pediatrics

## 2014-10-17 ENCOUNTER — Inpatient Hospital Stay: Payer: BLUE CROSS/BLUE SHIELD

## 2014-10-17 ENCOUNTER — Inpatient Hospital Stay: Payer: BLUE CROSS/BLUE SHIELD | Admitting: Certified Registered"

## 2014-10-17 ENCOUNTER — Encounter: Payer: Self-pay | Admitting: Otolaryngology

## 2014-10-17 DIAGNOSIS — J36 Peritonsillar abscess: Secondary | ICD-10-CM

## 2014-10-17 HISTORY — PX: INCISION & DRAINAGE, TONSILLAR, PERITONSILLAR ABSCESS: SHX4264

## 2014-10-17 LAB — CBC AND DIFFERENTIAL
Basophils Absolute Automated: 0.06 10*3/uL (ref 0.00–0.20)
Basophils Automated: 0 %
Eosinophils Absolute Automated: 0.06 10*3/uL (ref 0.00–0.70)
Eosinophils Automated: 0 %
Hematocrit: 31.5 % — ABNORMAL LOW (ref 42.0–52.0)
Hgb: 10.6 g/dL — ABNORMAL LOW (ref 13.0–17.0)
Immature Granulocytes Absolute: 0.06 10*3/uL — ABNORMAL HIGH
Immature Granulocytes: 0 %
Lymphocytes Absolute Automated: 2.76 10*3/uL (ref 0.50–4.40)
Lymphocytes Automated: 17 %
MCH: 31.1 pg (ref 28.0–32.0)
MCHC: 33.7 g/dL (ref 32.0–36.0)
MCV: 92.4 fL (ref 80.0–100.0)
MPV: 11.4 fL (ref 9.4–12.3)
Monocytes Absolute Automated: 1.59 10*3/uL — ABNORMAL HIGH (ref 0.00–1.20)
Monocytes: 10 %
Neutrophils Absolute: 11.36 10*3/uL — ABNORMAL HIGH (ref 1.80–8.10)
Neutrophils: 72 %
Platelets: 202 10*3/uL (ref 140–400)
RBC: 3.41 10*6/uL — ABNORMAL LOW (ref 4.70–6.00)
RDW: 14 % (ref 12–15)
WBC: 15.83 10*3/uL — ABNORMAL HIGH (ref 3.50–10.80)

## 2014-10-17 LAB — C-REACTIVE PROTEIN: C-Reactive Protein: 1.9 mg/dL — ABNORMAL HIGH (ref 0.0–0.8)

## 2014-10-17 LAB — CELL MORPHOLOGY
Cell Morphology: NORMAL
Platelet Estimate: NORMAL

## 2014-10-17 SURGERY — INCISION AND DRAINAGE, TONSILLAR, PERITONSILLAR ABSCESS
Anesthesia: Anesthesia General | Site: Throat | Wound class: Clean Contaminated

## 2014-10-17 MED ORDER — SUCCINYLCHOLINE CHLORIDE 20 MG/ML IJ SOLN
INTRAMUSCULAR | Status: DC | PRN
Start: 2014-10-17 — End: 2014-10-17
  Administered 2014-10-17 (×2): 100 mg via INTRAVENOUS

## 2014-10-17 MED ORDER — OXYCODONE-ACETAMINOPHEN 5-325 MG PO TABS
1.0000 | ORAL_TABLET | Freq: Once | ORAL | Status: DC | PRN
Start: 2014-10-17 — End: 2014-10-17

## 2014-10-17 MED ORDER — PROMETHAZINE HCL 25 MG/ML IJ SOLN
6.2500 mg | Freq: Once | INTRAMUSCULAR | Status: DC | PRN
Start: 2014-10-17 — End: 2014-10-17

## 2014-10-17 MED ORDER — HYDROMORPHONE HCL 1 MG/ML IJ SOLN
INTRAMUSCULAR | Status: AC
Start: 2014-10-17 — End: 2014-10-17
  Administered 2014-10-17: 0.5 mg via INTRAVENOUS
  Filled 2014-10-17: qty 1

## 2014-10-17 MED ORDER — DEXAMETHASONE SODIUM PHOSPHATE 4 MG/ML IJ SOLN (WRAP)
INTRAMUSCULAR | Status: DC | PRN
Start: 2014-10-17 — End: 2014-10-17
  Administered 2014-10-17: 10 mg via INTRAVENOUS

## 2014-10-17 MED ORDER — PROPOFOL INFUSION 10 MG/ML
INTRAVENOUS | Status: DC | PRN
Start: 2014-10-17 — End: 2014-10-17
  Administered 2014-10-17: 200 mg via INTRAVENOUS

## 2014-10-17 MED ORDER — FENTANYL CITRATE 0.05 MG/ML IJ SOLN
INTRAMUSCULAR | Status: DC | PRN
Start: 2014-10-17 — End: 2014-10-17
  Administered 2014-10-17: 50 ug via INTRAVENOUS
  Administered 2014-10-17 (×2): 25 ug via INTRAVENOUS

## 2014-10-17 MED ORDER — ONDANSETRON HCL 4 MG/2ML IJ SOLN
INTRAMUSCULAR | Status: AC
Start: 2014-10-17 — End: 2014-10-17
  Administered 2014-10-17: 4 mg via INTRAVENOUS
  Filled 2014-10-17: qty 2

## 2014-10-17 MED ORDER — PROPOFOL 10 MG/ML IV EMUL
INTRAVENOUS | Status: AC
Start: 2014-10-17 — End: ?
  Filled 2014-10-17: qty 20

## 2014-10-17 MED ORDER — MORPHINE SULFATE 2 MG/ML IJ/IV SOLN (WRAP)
2.0000 mg | Status: DC | PRN
Start: 2014-10-17 — End: 2014-10-17

## 2014-10-17 MED ORDER — ONDANSETRON HCL 4 MG/2ML IJ SOLN
INTRAMUSCULAR | Status: DC | PRN
Start: 2014-10-17 — End: 2014-10-17
  Administered 2014-10-17: 4 mg via INTRAVENOUS

## 2014-10-17 MED ORDER — FENTANYL CITRATE 0.05 MG/ML IJ SOLN
INTRAMUSCULAR | Status: AC
Start: 2014-10-17 — End: 2014-10-17
  Administered 2014-10-17: 25 ug via INTRAVENOUS
  Filled 2014-10-17: qty 2

## 2014-10-17 MED ORDER — MIDAZOLAM HCL 2 MG/2ML IJ SOLN
INTRAMUSCULAR | Status: AC
Start: 2014-10-17 — End: ?
  Filled 2014-10-17: qty 2

## 2014-10-17 MED ORDER — LIDOCAINE HCL 2 % IJ SOLN
INTRAMUSCULAR | Status: DC | PRN
Start: 2014-10-17 — End: 2014-10-17
  Administered 2014-10-17: 100 mg

## 2014-10-17 MED ORDER — ROCURONIUM BROMIDE 50 MG/5ML IV SOLN
INTRAVENOUS | Status: DC | PRN
Start: 2014-10-17 — End: 2014-10-17
  Administered 2014-10-17: 5 mg via INTRAVENOUS

## 2014-10-17 MED ORDER — FENTANYL CITRATE 0.05 MG/ML IJ SOLN
INTRAMUSCULAR | Status: AC
Start: 2014-10-17 — End: ?
  Filled 2014-10-17: qty 2

## 2014-10-17 MED ORDER — LIDOCAINE HCL (PF) 2 % IJ SOLN
INTRAMUSCULAR | Status: AC
Start: 2014-10-17 — End: ?
  Filled 2014-10-17: qty 5

## 2014-10-17 MED ORDER — DEXAMETHASONE SOD PHOSPHATE PF 10 MG/ML IJ SOLN
INTRAMUSCULAR | Status: AC
Start: 2014-10-17 — End: ?
  Filled 2014-10-17: qty 1

## 2014-10-17 MED ORDER — ONDANSETRON HCL 4 MG/2ML IJ SOLN
INTRAMUSCULAR | Status: AC
Start: 2014-10-17 — End: ?
  Filled 2014-10-17: qty 2

## 2014-10-17 MED ORDER — ONDANSETRON HCL 4 MG/2ML IJ SOLN
4.0000 mg | Freq: Once | INTRAMUSCULAR | Status: DC | PRN
Start: 2014-10-17 — End: 2014-10-17

## 2014-10-17 MED ORDER — IOHEXOL 350 MG/ML IV SOLN
100.0000 mL | Freq: Once | INTRAVENOUS | Status: AC | PRN
Start: 2014-10-17 — End: 2014-10-17
  Administered 2014-10-17: 100 mL via INTRAVENOUS

## 2014-10-17 MED ORDER — HYDROMORPHONE HCL 1 MG/ML IJ SOLN
0.5000 mg | INTRAMUSCULAR | Status: DC | PRN
Start: 2014-10-17 — End: 2014-10-17

## 2014-10-17 MED ORDER — SUCCINYLCHOLINE CHLORIDE 20 MG/ML IJ SOLN
INTRAMUSCULAR | Status: AC
Start: 2014-10-17 — End: ?
  Filled 2014-10-17: qty 10

## 2014-10-17 MED ORDER — ONDANSETRON HCL 4 MG/2ML IJ SOLN
4.0000 mg | Freq: Once | INTRAMUSCULAR | Status: AC
Start: 2014-10-17 — End: 2014-10-17

## 2014-10-17 MED ORDER — ROCURONIUM BROMIDE 50 MG/5ML IV SOLN
INTRAVENOUS | Status: AC
Start: 2014-10-17 — End: ?
  Filled 2014-10-17: qty 5

## 2014-10-17 MED ORDER — MIDAZOLAM HCL 2 MG/2ML IJ SOLN
INTRAMUSCULAR | Status: DC | PRN
Start: 2014-10-17 — End: 2014-10-17
  Administered 2014-10-17: 2 mg via INTRAVENOUS

## 2014-10-17 MED ORDER — LACTATED RINGERS IV SOLN
INTRAVENOUS | Status: DC | PRN
Start: 2014-10-17 — End: 2014-10-17

## 2014-10-17 MED ORDER — FENTANYL CITRATE 0.05 MG/ML IJ SOLN
25.0000 ug | INTRAMUSCULAR | Status: DC | PRN
Start: 2014-10-17 — End: 2014-10-17
  Administered 2014-10-17 (×2): 25 ug via INTRAVENOUS
  Filled 2014-10-17: qty 2

## 2014-10-17 SURGICAL SUPPLY — 26 items
CATH URETHRAL RED RUBBER 12F (Catheter Urine) ×4 IMPLANT
CATHETER YANKAUER BULBOUS TIP (Suction) ×2 IMPLANT
COVER CAM LF STRL LEN DISP CLR (Procedure Accessories) ×2
COVER CAMERA LENS DISPOSABLE CLEAR STERILE LATEX FREE (Procedure Accessories) ×1 IMPLANT
GLOVE SURG SUPER-SENSER SZ6.5 (Glove) ×2 IMPLANT
GOWN SURG MICROCOOL STRL LG (Gown) ×4 IMPLANT
MANIFOLD NEPTUNE II 4 PORT (Procedure Accessories) ×2 IMPLANT
NEEDLE 18GA 1-1/2IN (Needles) IMPLANT
NEEDLE 25GX5/8IN (Needles) ×2 IMPLANT
PAD ELECTROSRG GRND REM W CRD (Procedure Accessories) ×2 IMPLANT
SOLUTION IRR 0.9% NACL 1000ML LF STRL (Irrigation Solutions) ×1
SOLUTION IRRIGATION 0.9% SODIUM CHLORIDE (Irrigation Solutions) ×1
SOLUTION IRRIGATION 0.9% SODIUM CHLORIDE 1000 ML PLASTIC POUR BOTTLE (Irrigation Solutions) ×1 IMPLANT
SPONGE GAUZE L4 IN X W4 IN 16 PLY (Sponge) ×1
SPONGE GAUZE L4 IN X W4 IN 16 PLY MAXIMUM ABSORBENT TRAY CURITY (Sponge) ×1 IMPLANT
SPONGE GZE PLS CTTN CRTY 4X4IN LF STRL (Sponge) ×1
SPONGE TONSIL MEDIUM (Sponge) ×2 IMPLANT
SUTURE ABS 3-0 SH VCL 27IN BRD COAT UD (Suture)
SUTURE COATED VICRYL 3-0 SH L27 IN BRAID (Suture) IMPLANT
SYRINGE 3ML LL (Syringes, Needles) ×2 IMPLANT
TRAY MINOR (Pack) ×2 IMPLANT
TUBE NG PVC S SMP 14FR 48IN LF STRL (Tubing) ×2
TUBE OD14 FR INTEGRAL FUNNEL CONNECTOR 2 LUMEN 5 IN 1 ADAPTER (Tubing) ×1 IMPLANT
TUBING SCT PVC ARG 3/16IN 10FT LF STRL (Tubing) ×1
TUBING SUCTION ID3/16 IN L10 FT (Tubing) ×1
TUBING SUCTION ID3/16 IN L10 FT NONCONDUCTIVE STRAIGHT MALE FEMALE (Tubing) ×1 IMPLANT

## 2014-10-17 NOTE — Anesthesia Postprocedure Evaluation (Signed)
Anesthesia Post Evaluation    Patient: Barry Gross    Procedure(s):  INCISION & DRAINAGE, TONSILLAR, PERITONSILLAR ABSCESS    Anesthesia type: general    Last Vitals:   Filed Vitals:    10/17/14 1802   BP: 128/92   Pulse: 100   Temp: 36.7 C (98 F)   Resp: 15   SpO2: 100%            Anesthesia Qualified Clinical Data Registry    Central Line      CVC insertion : NO                                               Perioperative temperature management      General/neuraxial anesthesia > or = 60 minutes (excluding CABG) : NO                                Administration of antibiotic prophylaxis      Age > or = 18, with IV access, with surgical procedure for which antibiotic prophylaxis indicated, and not on chronic antibiotics : YES              > Prophylactic antibiotics within 1 hour of incision (or fluroroquinolone/vancomycin within 2 hours of incision) : YES    Medication Administration      Ordering or administration of drug inconsistent with intended drug, dose, delivery or timing : NO      Dental loss/damage      Dental injury with administration of anesthesia : NO      Difficult intubation due to unrecognized difficult airway        Elective airway procedure including but not limited to: tracheostomy, fiberoptic bronchoscopy, rigid bronchoscopy; jet ventilation; or elective use of a device to facilitate airway management such as a Glidescope : NO                > Unanticipated difficult intubation post pre-evaluation : NO      Aspiration of gastric contents        Aspiration of gastric contents : NO                    Surgical fire        Procedure requiring electrocautery/laser : YES                > Ignition/burning in invasive procedure location : NO      Immediate perioperative cardiac arrest        Cardiac arrest in OR or PACU : NO                    Unplanned hospital admission        Unplanned hospital admission for initially intended outpatient anesthesia service : NO      Unplanned ICU admission         Unplanned ICU admission related to anesthesia occurring within 24 hours of induction or start of MAC : NO      Surgical case cancellation        Cancellation of procedure after care already started by anesthesia care team : NO      Post-anesthesia transfer of care checklist/protocol to PACU        Transfer from OR to PACU upon case conclusion :  YES              > Use of PACU transfer checklist/protocol : YES     (Includes the key elements of: patient identification, responsible practitioner identification (PACU nurse or advanced practitioner), discussion of pertinent history and procedure course, intraoperative anesthetic management, post-procedure plans, acknowledgement/questions)    Post-anesthesia transfer of care checklist/protocol to ICU        Transfer from OR to ICU upon case conclusion : NO                    Post-operative nausea/vomiting risk protocol        Post-operative nausea/vomiting risk protocol : YES  Patient > or = 18 with care initiated by anesthesia team that has a risk factor screen for post-op nausea/vomiting (Includes male, hx PONV, or motion sickness, non-smoker, intended opioid administration for post-op analgesia.)    Anaphylaxis        Anaphylaxis during anesthesia services : NO    (Inclusive of any suspected transfusion reaction in association with blood-bank confirmed blood product incompatibility)              Curt Bears Clifton Kovacic, 10/17/2014 6:04 PM

## 2014-10-17 NOTE — Progress Notes (Signed)
PROGRESS NOTE    Date Time: 10/17/2014 12:50 PM  Patient Name: Barry Gross, Barry Gross          Subjective:   19 yo male who was admitted to the hospital on 09-30-14 with right peritonsillar cellulitis and evaluated by Dr. Edward Jolly. He improved with IV antibiotics, steroids and hydration and was discharged to home on oral antibiotics. He was seen in the office on 10-06-14 and was feeling much better. He completed his antibiotics on 10-11-14 and 2 days later his right-sided sore throat recurred and got progressively worse over the next several days. He was seen in the ER on 10-15-14 and readmitted. He had a CT scan of the neck today consistent with a collection of fluid in the right peritonsillar space.     Current Facility-Administered Medications   Medication Dose Route Frequency   . cefTRIAXone  2 g Intravenous Q24H SCH   . clindamycin  900 mg Intravenous Q8H SCH     acetaminophen (PEDS), HYDROcodone-acetaminophen, ketorolac, morphine  . dextrose 5 % and 0.45% NaCl 100 mL/hr at 10/17/14 0640       Physical Exam:     Filed Vitals:    10/17/14 1144   BP: 126/67   Pulse: 78   Temp: 98.6 F (37 C)   Resp: 18   SpO2: 99%       Intake and Output Summary (Last 24 hours) at Date Time    Intake/Output Summary (Last 24 hours) at 10/17/14 1250  Last data filed at 10/17/14 1133   Gross per 24 hour   Intake   3303 ml   Output   2850 ml   Net    453 ml       Physical Examination: General appearance - alert, well appearing, and in no distress and in mild to moderate distress  Ears - bilateral TM's and external ear canals normal  Nose - normal and patent, no erythema, discharge or polyps  Mouth - swelling right peritonsillar area with uvular shift to the left  Neck - some right jugulodigastric adenotonsillar adenopathy.      Labs:     Results     Procedure Component Value Units Date/Time    C Reactive Protein [[id number]]  (Abnormal) Collected:  10/17/14 0522    Specimen Information:  Blood Updated:  10/17/14 0635     C-Reactive Protein 1.9  (H) mg/dL     CBC and differential [[id number]]  (Abnormal) Collected:  10/17/14 0523    Specimen Information:  Blood / Blood Updated:  10/17/14 0611     WBC 15.83 (H) x10 3/uL      Hgb 10.6 (L) g/dL      Hematocrit 32.4 (L) %      Platelets 202 x10 3/uL      RBC 3.41 (L) x10 6/uL      MCV 92.4 fL      MCH 31.1 pg      MCHC 33.7 g/dL      RDW 14 %      MPV 11.4 fL      Neutrophils 72 %      Lymphocytes Automated 17 %      Monocytes 10 %      Eosinophils Automated 0 %      Basophils Automated 0 %      Immature Granulocyte 0 %      Neutrophils Absolute 11.36 (H) x10 3/uL      Abs Lymph Automated 2.76 x10 3/uL  Abs Mono Automated 1.59 (H) x10 3/uL      Abs Eos Automated 0.06 x10 3/uL      Absolute Baso Automated 0.06 x10 3/uL      Absolute Immature Granulocyte 0.06 (H) x10 3/uL     Cell MorpHology [[id number]] Collected:  10/17/14 0523     Cell Morphology: Normal Updated:  10/17/14 4696     Platelet Estimate Normal           Rads:   Radiological Procedure reviewed.  Radiology Results (24 Hour)     Procedure Component Value Units Date/Time    CT Soft Tissue Neck W Contrast [[id number]]     Order Status:  Sent Updated:  10/17/14 1132          Assessment:   Right peritonsillar abscess. .     Plan:   He will be brought to the OR today for incision and drainage under anesthesia.    Signed by: Toney Rakes, MD

## 2014-10-17 NOTE — Anesthesia Postprocedure Evaluation (Signed)
Anesthesia Post Evaluation    Patient: Barry Gross    Procedure(s):  INCISION & DRAINAGE, TONSILLAR, PERITONSILLAR ABSCESS    Anesthesia type: general    Last Vitals:   Filed Vitals:    10/17/14 1911   BP: 130/67   Pulse: 78   Temp: 37.1 C (98.8 F)   Resp: 20   SpO2: 100%       Patient Location: Phase I PACU      Post Pain: Patient not complaining of pain, continue current therapy    Mental Status: awake    Respiratory Function: tolerating room air    Cardiovascular: stable    Nausea/Vomiting: patient not complaining of nausea or vomiting    Hydration Status: adequate    Post Assessment: no apparent anesthetic complications          Anesthesia Qualified Clinical Data Registry    Central Line      CVC insertion : NO                                               Perioperative temperature management      General/neuraxial anesthesia > or = 60 minutes (excluding CABG) : NO                                Administration of antibiotic prophylaxis      Age > or = 18, with IV access, with surgical procedure for which antibiotic prophylaxis indicated, and not on chronic antibiotics : YES              > Prophylactic antibiotics within 1 hour of incision (or fluroroquinolone/vancomycin within 2 hours of incision) : YES    Medication Administration      Ordering or administration of drug inconsistent with intended drug, dose, delivery or timing : NO      Dental loss/damage      Dental injury with administration of anesthesia : NO      Difficult intubation due to unrecognized difficult airway        Elective airway procedure including but not limited to: tracheostomy, fiberoptic bronchoscopy, rigid bronchoscopy; jet ventilation; or elective use of a device to facilitate airway management such as a Glidescope : NO                > Unanticipated difficult intubation post pre-evaluation : NO      Aspiration of gastric contents        Aspiration of gastric contents : NO                    Surgical fire        Procedure  requiring electrocautery/laser : NO                    Immediate perioperative cardiac arrest        Cardiac arrest in OR or PACU : NO                    Unplanned hospital admission        Unplanned hospital admission for initially intended outpatient anesthesia service : NO      Unplanned ICU admission        Unplanned ICU admission related to anesthesia occurring within 24 hours of induction or  start of MAC : NO      Surgical case cancellation        Cancellation of procedure after care already started by anesthesia care team : NO      Post-anesthesia transfer of care checklist/protocol to PACU        Transfer from OR to PACU upon case conclusion : YES              > Use of PACU transfer checklist/protocol : YES     (Includes the key elements of: patient identification, responsible practitioner identification (PACU nurse or advanced practitioner), discussion of pertinent history and procedure course, intraoperative anesthetic management, post-procedure plans, acknowledgement/questions)    Post-anesthesia transfer of care checklist/protocol to ICU        Transfer from OR to ICU upon case conclusion : NO                    Post-operative nausea/vomiting risk protocol        Post-operative nausea/vomiting risk protocol : NO  Patient > or = 18 with care initiated by anesthesia team that has a risk factor screen for post-op nausea/vomiting (Includes male, hx PONV, or motion sickness, non-smoker, intended opioid administration for post-op analgesia.)    Anaphylaxis        Anaphylaxis during anesthesia services : NO    (Inclusive of any suspected transfusion reaction in association with blood-bank confirmed blood product incompatibility)              Tessa Lerner, 10/17/2014 7:20 PM

## 2014-10-17 NOTE — Brief Op Note (Signed)
BRIEF OP NOTE    Date Time: 10/17/2014 5:52 PM    Patient Name:   Barry Gross    Date of Operation:   10/17/2014    Providers Performing:   Surgeon(s):  Toney Rakes, MD    Assistant (s):    Operative Procedure:   Procedure(s):  INCISION & DRAINAGE, TONSILLAR, PERITONSILLAR ABSCESS    Preoperative Diagnosis:   Right Peritonsillar abscess    Postoperative Diagnosis:   SAME    Anesthesia:   General    Estimated Blood Loss:    less than 10 cc    Drains:   Drains: no    Specimens:       Findings:   5 to 10 cc of pus drainfed from right peritonsillar space, posterior    Complications:   none      Signed by: Toney Rakes, MD                                                                              [place] [facility]

## 2014-10-17 NOTE — Anesthesia Preprocedure Evaluation (Signed)
Anesthesia Evaluation    AIRWAY    Mallampati: II    TM distance: >3 FB  Neck ROM: full  Mouth Opening:limited   CARDIOVASCULAR    cardiovascular exam normal, regular and normal       DENTAL    no notable dental hx     PULMONARY    pulmonary exam normal and clear to auscultation     OTHER FINDINGS                      Anesthesia Plan    ASA 2 - emergent     general                     intravenous induction   Detailed anesthesia plan: general endotracheal      Post Op: other  Post op pain management: per surgeon    informed consent obtained    Plan discussed with CRNA.      pertinent labs reviewed             ===============================================================  Inpatient Anesthesia Evaluation    Patient Name: Barry Gross,Barry Gross  Surgeon: Toney Rakes, MD  Patient Age / Sex: 19 y.o. / male    Medical History:   History reviewed. No pertinent past medical history.    History reviewed. No pertinent past surgical history.      Allergies:   No Known Allergies      Medications:     Current Facility-Administered Medications   Medication Dose Route Frequency Last Rate Last Dose   . acetaminophen (PEDS) (TYLENOL) 160 MG/5ML suspension 640 mg  640 mg Oral Q4H PRN       . cefTRIAXone (ROCEPHIN) 2 g in sodium chloride 0.9 % 100 mL IVPB mini-bag plus  2 g Intravenous Q24H SCH 200 mL/hr at 10/17/14 0834 2 g at 10/17/14 0834   . clindamycin (CLEOCIN) 900mg  in D5W 50mL IVPB (premix)  900 mg Intravenous Q8H SCH 100 mL/hr at 10/17/14 0934 900 mg at 10/17/14 0934   . dextrose  5 % and 0.45 % NaCl infusion   Intravenous Continuous 100 mL/hr at 10/17/14 0640     . HYDROcodone-acetaminophen (HYCET) 7.5-325 MG/15ML solution 15 mL  15 mL Oral Q4H PRN   15 mL at 10/17/14 1103   . ketorolac (TORADOL) injection 30 mg  30 mg Intravenous Q6H PRN   30 mg at 10/17/14 0634   . morphine injection 2 mg  2 mg Intravenous Q4H PRN   2 mg at 10/16/14 1315              Prior to Admission medications    Medication Sig Start Date End Date  Taking? Authorizing Provider   ibuprofen (ADVIL,MOTRIN) 100 MG chewable tablet Chew 4-6 tablets (400-600 mg total) by mouth every 6 (six) hours as needed for Pain or Fever. 09/30/14  Yes Gertie Fey, MD   acetaminophen (TYLENOL) 160 MG chewable tablet Chew 4 tablets (640 mg total) by mouth every 4 (four) hours as needed for Pain or Fever. 09/30/14   Gertie Fey, MD   amoxicillin-clavulanate (AUGMENTIN ES-600) 600-42.9 MG/5ML suspension Take 7.5 mLs (900 mg total) by mouth 2 (two) times daily. For 10 days 09/30/14   Gertie Fey, MD   HYDROcodone-acetaminophen (HYCET) 7.5-325 MG/15ML solution Take 10 mLs by mouth every 6 (six) hours as needed (severe pain). 09/30/14   Gertie Fey, MD   prednisoLONE (ORAPRED) 15 MG/5ML solution 14ml x 3 days  once daily , then 7ml once daily  for 2 days then stop 09/30/14   Gertie Fey, MD     Vitals   Temp:  [36.3 C (97.4 F)-37.1 C (98.7 F)] 36.9 C (98.4 F)  Heart Rate:  [70-80] 74  Resp Rate:  [16-20] 16  BP: (122-139)/(58-75) 139/75 mmHg    Wt Readings from Last 3 Encounters:   10/15/14 76 kg (167 lb 8.8 oz) (68 %*, Z = 0.46)   09/29/14 76.8 kg (169 lb 5 oz) (70 %*, Z = 0.53)     * Growth percentiles are based on CDC 2-20 Years data.     BMI (Estimated body mass index is 21.5 kg/(m^2) as calculated from the following:    Height as of this encounter: 1.88 m (6\' 2" ).    Weight as of this encounter: 76 kg (167 lb 8.8 oz).)  Temp Readings from Last 3 Encounters:   10/17/14 36.9 C (98.4 F) Temporal Artery   09/30/14 36.7 C (98 F) Temporal Artery     BP Readings from Last 3 Encounters:   10/17/14 139/75   09/30/14 118/56     Pulse Readings from Last 3 Encounters:   10/17/14 74   09/30/14 62           Labs:   CBC:  Lab Results   Component Value Date    WBC 15.83* 10/17/2014    HGB 10.6* 10/17/2014    HCT 31.5* 10/17/2014    PLT 202 10/17/2014       Chemistries:  Lab Results   Component Value Date    NA 137 10/15/2014    K 4.5 10/15/2014    CL 103  10/15/2014    CO2 23 10/15/2014    BUN 16.1 10/15/2014    CREAT 1.1 10/15/2014    GLU 91 10/15/2014    CA 10.0 10/15/2014    AST 27 10/15/2014       Coags:  No results found for: PT, PTT, INR  _____________________      Signed by: Wendie Simmer  10/17/2014   4:16 PM    =============================================================

## 2014-10-17 NOTE — PACU (Addendum)
1818 medicated for pain,with relief, placed on O2 2 L,   1834 Mom updated

## 2014-10-17 NOTE — Progress Notes (Signed)
PEDS PROGRESS NOTE    Date Time: 10/17/2014 7:17 AM  Patient Name: Barry Gross, Barry Gross  19 y.o., male  Problem list:     Patient Active Problem List   Diagnosis   . Throat pain   . Phlegmonous tonsillitis on right   . Tonsillitis         Assessment:   19 year old male admitted with tonsillitis.  Minimal clinical improvement with medical management, but WBC and CRP trending down.  Continued pain requiring IV meds.  Cannot r/o abscess given no imaging, but uvula is midline on exam.     Plan:   Resp/cv: stable  ID:  Continue Rocephin and Clindamycin.    ENT:  Called Dr. Caryn Section (pt followed up with her after last discharge).  Awaiting call back.  ? Need for repeat imaging.    FEN/GI:  NPO in case he goes to the OR.  MIVF  Neuro:  Toradol, morphine prn pain.  Hycet prn pain.        Subjective:   No acute events overnight.  Ate some dinner last night.  Right throat and right ear pain continues to be 8/10 in severity when pain meds wear off.  No fever      Physical Exam:     Filed Vitals:    10/17/14 0000 10/17/14 0100 10/17/14 0335 10/17/14 0759   BP: 122/62   128/58   Pulse: 76  76 70   Temp: 98.7 F (37.1 C)  98 F (36.7 C) 98.3 F (36.8 C)   TempSrc: Temporal Artery  Temporal Artery Temporal Artery   Resp: 20  20 18    Height:       Weight:       SpO2: 99% 98%  98%     Intake and Output Summary (Last 24 hours) at Date Time    Intake/Output Summary (Last 24 hours) at 10/17/14 0717  Last data filed at 10/17/14 9604   Gross per 24 hour   Intake   4043 ml   Output   3000 ml   Net   1043 ml     General:  Tired appearing, no acute distress   Skin: no rashes  HEENT: MMM, right >L tonsillar hypertrophy.  No exudate.  Uvula midline.  No trismus.    Neck: right cervical lymphadenopathy   CV: nl s1 s2 RRR, no murmurs, cap refill <2 sec, 2+ pulses  Chest: CTA bilaterally, no increased work of breathing  Abd: soft, NT/ND, no HSM, normoactive BS  Ext: WWP     Labs:     Results     Procedure Component Value Units Date/Time    C Reactive  Protein [[id number]]  (Abnormal) Collected:  10/17/14 0522    Specimen Information:  Blood Updated:  10/17/14 0635     C-Reactive Protein 1.9 (H) mg/dL     CBC and differential [[id number]]  (Abnormal) Collected:  10/17/14 0523    Specimen Information:  Blood / Blood Updated:  10/17/14 0611     WBC 15.83 (H) x10 3/uL      Hgb 10.6 (L) g/dL      Hematocrit 30.8 (L) %      Platelets 202 x10 3/uL      RBC 3.41 (L) x10 6/uL      MCV 92.4 fL      MCH 31.1 pg      MCHC 33.7 g/dL      RDW 14 %      MPV 11.4 fL  Neutrophils 72 %      Lymphocytes Automated 17 %      Monocytes 10 %      Eosinophils Automated 0 %      Basophils Automated 0 %      Immature Granulocyte 0 %      Neutrophils Absolute 11.36 (H) x10 3/uL      Abs Lymph Automated 2.76 x10 3/uL      Abs Mono Automated 1.59 (H) x10 3/uL      Abs Eos Automated 0.06 x10 3/uL      Absolute Baso Automated 0.06 x10 3/uL      Absolute Immature Granulocyte 0.06 (H) x10 3/uL     Cell MorpHology [[id number]] Collected:  10/17/14 0523     Cell Morphology: Normal Updated:  10/17/14 4098     Platelet Estimate Normal             Rads:     Radiology Results (24 Hour)     ** No results found for the last 24 hours. **              Signed by: Elgie Congo      Total time spent in eval/mgmt: 35 min  Time in Counseling/coord care: was greater than 50%  Counseling/Coord details:  Discussed with assessment and plan with pt

## 2014-10-17 NOTE — Transfer of Care (Signed)
Anesthesia Transfer of Care Note    Patient: Barry Gross    Procedures performed: Procedure(s):  INCISION & DRAINAGE, TONSILLAR, PERITONSILLAR ABSCESS    Anesthesia type: General ETT    Patient location:Phase I PACU    Last vitals:   Filed Vitals:    10/17/14 1802   BP: 128/92   Pulse: 100   Temp: 36.7 C (98 F)   Resp: 15   SpO2: 100%       Post pain: Patient not complaining of pain, continue current therapy      Mental Status:awake    Respiratory Function: tolerating room air    Cardiovascular: stable    Nausea/Vomiting: patient not complaining of nausea or vomiting    Hydration Status: adequate    Post assessment: no apparent anesthetic complications, no reportable events, no evidence of recall and see quality assurance indicators

## 2014-10-18 MED ORDER — CEFDINIR 250 MG/5ML PO SUSR
300.0000 mg | Freq: Two times a day (BID) | ORAL | Status: AC
Start: 2014-10-18 — End: 2014-10-25

## 2014-10-18 MED ORDER — CLINDAMYCIN PALMITATE HCL 75 MG/5ML PO SOLR
300.0000 mg | Freq: Three times a day (TID) | ORAL | Status: AC
Start: 2014-10-18 — End: 2014-10-25

## 2014-10-18 NOTE — Plan of Care (Signed)
Problem: Pain  Goal: Patient's pain/discomfort is manageable  Outcome: Progressing  Patient reports manageable pain with Hycet and toradol    Problem: Inadequate Gas Exchange: Respiratory (Peds)  Goal: Child maintains adequate oxygenation/ventilation  Outcome: Progressing  Patient doing well on room air, no respiratory needs    Problem: High Fall Risk (PEDS)(Score >12)  Goal: Patient will remain free of falls (Peds)  Outcome: Progressing

## 2014-10-18 NOTE — Progress Notes (Signed)
Discharge instructions given to patient and father with verbal understanding. Discharged to home with father.

## 2014-10-18 NOTE — Discharge Instructions (Signed)
Peritonsillar Abscess  A peritonsillar abscess is a collection of pus that forms near the tonsils. It is a complication of bacterial infection of the tonsils (tonsillitis). The abscess causes one or both tonsils to swell. The infection and swelling may spread to nearby tissues. If tissues swell enough to block the throat, the condition can become life threatening. It is also dangerous if the abscess bursts and the infection spreads or is breathed into the lungs. The goal is to treat a peritonsillar abscess before it worsens and threatens your health.    Signs and Symptoms of Peritonsillar Abscess   Severe sore throat (often worse on one side)   Swollen and enlarged tonsils   Fever and chills   Pain when swallowing or trouble opening the mouth   Voice changes   Drooling   Swollen or tender glands in the neck  Diagnosing Peritonsillar Abscess  Your doctor will examine you and look inside your mouth and throat. You will be asked about your symptoms and health history. Tests or procedures may be done as well, including those listed below.   Throat swab. This test checks for infection. It is done by wiping a sterile cotton swab in the back of the throat. The swab is then sent to a lab for study.   Blood tests. These might be done to check how your body is responding to the infection.   Ultrasound or computed tomography (CT) scans. These tests provide images of the abscess. They also help rule out other problems.   Needle aspiration. This procedure removes a sample of pus from the abscess with a needle. The sample is then sent to a lab to check for infection. In some cases, all of the pus is removed from the abscess.  Treating Peritonsillar Abscess  The abscess itself can be treated. Treatment of the underlying infection is also needed. Common treatments are listed below.   Medications. Antibiotics are needed to treat the underlying infection. These may be taken by mouth or given by IV. Pain relievers may  also be given, if needed.   Drainage of the abscess. A procedure may be needed to drain the pus from the abscess. Pus may be removed from the abscess with a needle (needle aspiration). Or, a small incision is made in the abscess. The pus is then drained and suctioned from the throat and mouth. This is called incision and drainage.   Tonsillectomy. This is surgery to remove the tonsils. It may be done if the abscess does not improve with medications. It may also be done if you have frequent tonsil infections or abscesses.  Recovery and Follow-Up  Treating the bacterial infection generally relieves the problem. Once the infection resolves you should recover completely. Follow up with your doctor as directed. And if you develop another throat infection, see your doctor promptly.   2000-2015 [employer] [address], [place] [address]. All rights reserved. This information is not intended as a substitute for professional medical care. Always follow your healthcare professional's instructions.

## 2014-10-18 NOTE — Op Note (Signed)
Procedure Date: 10/17/2014     Patient Type: I     SURGEON: Toney Rakes MD  ASSISTANT:       PREOPERATIVE DIAGNOSIS:  Right peritonsillar abscess.     POSTOPERATIVE DIAGNOSIS:  Right peritonsillar abscess.     TITLE OF PROCEDURE:  Incision and drainage of right peritonsillar abscess.     ANESTHESIA:  General endotracheal tube.     INDICATIONS:  The patient is a 19 year old male who was previously admitted in the  hospital on September 29, 2014, with a severe right-sided sore throat and right  peritonsillar cellulitis.  He responded well to IV antibiotics and steroids  and was discharged to home on oral antibiotics and steroids.  He had a  followup visit in the office, at which time was a week from his discharge  from the hospital and he was doing well.  However, he had a relapse of his  sore throat approximately 2 days after finishing with oral antibiotics and  presented back to the ER with significant right-sided sore throat,  difficulty eating and drinking and trismus.  He was then restarted on IV  antibiotics and steroids.  CT scan was repeated after 2 days because the  patient was not showing significant improvement and it was consistent with  a right peritonsillar abscess.  He therefore presents for incision and  drainage of right peritonsillar abscess.     DESCRIPTION OF PROCEDURE:  With the patient in the supine position, after adequate general  endotracheal anesthesia, an approximately 1.5-cm incision was made in the  right peritonsillar fossa in an area of tense, erythematous soft tissue.   The incision was carried down through subcutaneous tissues using a number  11 scalpel.  A long hemostat was then used to spread the peritonsillar  space and at the posterior aspect of the peritonsillar space, an abscess  cavity containing 5 to 10 mL of purulent drainage was identified.  Culture  was obtained and the abscess cavity was copiously irrigated with saline  solution.  Estimated blood loss during the procedure  was less than 10 mL.   The patient tolerated the procedure well and was transferred to recovery in  satisfactory condition.           D:  10/17/2014 18:36 PM by Dr. Gentry Fitz A. Caryn Section, MD [phone])  T:  10/18/2014 11:40 AM by [id number]      Everlean Cherry: [id number]) (Doc ID: [id number])

## 2014-10-18 NOTE — Discharge Summary (Signed)
Physician Discharge Summary    Patient ID:  Barry Gross  [id number]  18 y.o.  June 08, 1996    Admit date: 10/15/2014    Discharge date and time: 10/18/14   Admitting Physician: Gertie Fey, MD   Discharge Physician: Elgie Congo    Admission Diagnoses: Peritonsillar abscess [J36]  Pharyngitis [J02.9]    Discharge Diagnoses:   Patient Active Problem List   Diagnosis   . Throat pain   . Phlegmonous tonsillitis on right   . Tonsillitis   . Peritonsillar abscess        Admission Condition: stable    Brief HPI:  19 year old male with tonsillitis and right peritonsillar phlegmon. Admitted 3/17 and received medical management with Clindamycin and decadron.  He improved so was sent home on Augmentin and prednisone taper.  He initially improved while at home, but then throat pain worsened after abx stopped so he returned to the ED.     Hospital Course:  Barry Gross was admitted and started on rocephin and Clindamycin.  No steroids given.  He continued to have severe throat pain requiring IV pain medicine after 48 hours of medical management.  Dr. Caryn Section was called and recommended repeat CT scan.  CT scan appeared worse from previous admission with 2.5 cm phglemon vs. Abscess on right.  HE was taken to the OR on 4/4 for I&D (5-10 cc pus drained).  Discharged home today on clindamycin and omnicef to complete 10 day course (7 more days).   F/u Dr. Caryn Section in 1 week     Discharge Exam:  Filed Vitals:    10/17/14 1850 10/17/14 1911 10/18/14 0006 10/18/14 0356   BP: 135/69 130/67 126/69 115/54   Pulse: 78 78 72 69   Temp:  98.8 F (37.1 C) 98.2 F (36.8 C) 98.8 F (37.1 C)   TempSrc:  Temporal Artery Temporal Artery Temporal Artery   Resp: 18 20 20 18    Height:       Weight:       SpO2: 100% 100% 98% 97%     General:  Well appearing, no acute distress  Skin: no rashes  HEENT: MMM, right tonsillar incision.  Less tonsillar edema on right.  Uvula midline.  No trismus   CV: nl s1 s2 RRR, no murmurs, cap refill <2 sec, 2+  pulses  Chest: CTA bilaterally, no increased work of breathing  Abd: soft, NT/ND, no HSM, normoactive BS  Ext: WWP   Neuro: grossly intact, no focal deficits     Consults: ENT    Significant Diagnostic Studies  Radiology Results (24 Hour)     Procedure Component Value Units Date/Time    CT Soft Tissue Neck W Contrast [[id number]] Collected:  10/17/14 1320    Order Status:  Completed Updated:  10/17/14 1330    Narrative:      History: Tonsillitis, evaluate for an abscess.    FINDINGS: Neck CT following administration of 100 cc of Omnipaque 350.  Correlation with a brain CT dated September 29, 2014.    There is again a right peritonsillar hypodensity. This measures  approximately 2.5 x 1.3 cm in axial plane (previously 1.6 x 1.0 cm). It  is somewhat infiltrating. There is enhancing tissue surrounding it.  Finding is most consistent with a phlegmon with central areas of mild  liquefaction, slightly larger than on the prior examination. There is  edema in the adjacent soft tissues. There is minor mass effect on the  oropharynx and hypopharynx. There  is mild cervical adenopathy. A larger  right level 2 lymph node measures 2.1 x 1.7 cm in axial plane  (previously 2.4 x 1.8 cm). Additional somewhat prominent lymph nodes are  seen in the posterior triangles with a larger one on the left side  measuring 2.3 x 1.2 cm in axial plane.      Impression:       Slight interval increase in extent of inflammatory changes  in the right peritonsillar region. There are findings consistent with a  moderately prominent phlegmon with probable central areas of  liquefaction.    Terrilee Croak, MD   10/17/2014 1:26 PM               Discharge Medication List      Taking          acetaminophen 160 MG chewable tablet   Dose:  640 mg   Commonly known as:  TYLENOL   Chew 4 tablets (640 mg total) by mouth every 4 (four) hours as needed for Pain or Fever.       cefdinir 250 MG/5ML suspension   Dose:  300 mg   Commonly known as:  OMNICEF   Take 6 mLs  (300 mg total) by mouth 2 (two) times daily.       clindamycin 75 MG/5ML solution   Dose:  300 mg   Commonly known as:  CLEOCIN   Take 20 mLs (300 mg total) by mouth every 8 (eight) hours.       HYDROcodone-acetaminophen 7.5-325 MG/15ML solution   Dose:  10 mL   Commonly known as:  HYCET   Take 10 mLs by mouth every 6 (six) hours as needed (severe pain).       ibuprofen 100 MG chewable tablet   Dose:  400-600 mg   Commonly known as:  ADVIL,MOTRIN   Chew 4-6 tablets (400-600 mg total) by mouth every 6 (six) hours as needed for Pain or Fever.         STOP taking these medications          amoxicillin-clavulanate 600-42.9 MG/5ML suspension   Commonly known as:  AUGMENTIN ES-600       prednisoLONE 15 MG/5ML solution   Commonly known as:  ORAPRED           Discharge meds: as above   Follow-up with Dr. Caryn Section in 1 week.  Discharged Condition: stable  Discharge time: >82min  Disposition: home   Signed:  Elgie Congo  10/18/2014  7:21 AM  Georgena Spurling, MD

## 2014-11-14 ENCOUNTER — Telehealth: Payer: BLUE CROSS/BLUE SHIELD

## 2014-11-14 NOTE — Pre-Procedure Instructions (Signed)
PATIENT HAS APPT WITH DR Marnette Burgess FOR INTERMITTANT CHEST PAIN ON 11/17/2014. NOTES & CLEARANCE REQUESTED. WILL ARRIVE 0815 FOR 0945 SURGERY ON 11/24/2014

## 2014-11-17 ENCOUNTER — Ambulatory Visit (INDEPENDENT_AMBULATORY_CARE_PROVIDER_SITE_OTHER): Payer: Self-pay | Admitting: Cardiovascular Disease

## 2014-11-22 ENCOUNTER — Ambulatory Visit (INDEPENDENT_AMBULATORY_CARE_PROVIDER_SITE_OTHER): Payer: Self-pay

## 2014-11-24 ENCOUNTER — Ambulatory Visit: Admission: RE | Admit: 2014-11-24 | Payer: BLUE CROSS/BLUE SHIELD | Source: Ambulatory Visit | Admitting: Otolaryngology

## 2014-11-24 ENCOUNTER — Encounter: Admission: RE | Payer: Self-pay | Source: Ambulatory Visit

## 2014-11-24 SURGERY — TONSILLECTOMY
Anesthesia: Anesthesia General | Site: Mouth

## 2014-12-06 NOTE — Anesthesia Preprocedure Evaluation (Addendum)
Anesthesia Evaluation    AIRWAY    Mallampati: III    TM distance: >3 FB  Neck ROM: full  Mouth Opening:full   CARDIOVASCULAR    cardiovascular exam normal       DENTAL    no notable dental hx     PULMONARY    pulmonary exam normal     OTHER FINDINGS                      Anesthesia Plan    ASA 1     general               (Negative stress test.)      intravenous induction         Post Op: other  Post op pain management: per surgeon    informed consent obtained    Plan discussed with CRNA.                   ===============================================================  Inpatient Anesthesia Evaluation    Patient Name: Barry Gross,Barry Gross  Surgeon: Toney Rakes, MD  Patient Age / Sex: 19 y.o. / male    Medical History:     Past Medical History   Diagnosis Date   . Chest pain      HAS APPT WITH [facility]-DR AZIZ-5/05-NOTES REQUESTED       Past Surgical History   Procedure Laterality Date   . Incision & drainage, tonsillar, peritonsillar abscess N/Gross 10/17/2014     Procedure: INCISION & DRAINAGE, TONSILLAR, PERITONSILLAR ABSCESS;  Surgeon: Toney Rakes, MD;  Location: [facility];  Service: ENT;  Laterality: N/Gross;         Allergies:   No Known Allergies      Medications:     Current Facility-Administered Medications   Medication Dose Route Frequency Last Rate Last Dose   . ceFAZolin (ANCEF) 1 G injection           . ceFAZolin (ANCEF) 2 g in dextrose 5 % 50 mL IVPB  2 g Intravenous Once       . lactated ringers infusion   Intravenous Continuous 30 mL/hr at 12/07/14 0645 1,000 mL at 12/07/14 0645              Prior to Admission medications    Not on File     Vitals   Temp:  [36.3 C (97.4 F)] 36.3 C (97.4 F)  Heart Rate:  [62] 62  Resp Rate:  [18] 18  BP: (123)/(78) 123/78 mmHg    Wt Readings from Last 3 Encounters:   12/07/14 76.749 kg (169 lb 3.2 oz) (69 %*, Z = 0.50)   11/14/14 81.704 kg (180 lb 2 oz) (81 %*, Z = 0.86)   10/15/14 76 kg (167 lb 8.8 oz) (68 %*, Z = 0.46)     * Growth percentiles are based on CDC  2-20 Years data.     BMI (Estimated body mass index is 21.71 kg/(m^2) as calculated from the following:    Height as of this encounter: 1.88 m (6\' 2" ).    Weight as of this encounter: 76.749 kg (169 lb 3.2 oz).)  Temp Readings from Last 3 Encounters:   12/07/14 36.3 C (97.4 F) Temporal Artery   10/18/14 36.7 C (98.1 F) Temporal Artery   09/30/14 36.7 C (98 F) Temporal Artery     BP Readings from Last 3 Encounters:   12/07/14 123/78   10/18/14 135/64   09/30/14  118/56     Pulse Readings from Last 3 Encounters:   12/07/14 62   10/18/14 96   09/30/14 62           Labs:   CBC:  Lab Results   Component Value Date    WBC 15.83* 10/17/2014    HGB 10.6* 10/17/2014    HCT 31.5* 10/17/2014    PLT 202 10/17/2014       Chemistries:  Lab Results   Component Value Date    NA 137 10/15/2014    K 4.5 10/15/2014    CL 103 10/15/2014    CO2 23 10/15/2014    BUN 16.1 10/15/2014    CREAT 1.1 10/15/2014    GLU 91 10/15/2014    CA 10.0 10/15/2014    AST 27 10/15/2014     _____________________      Signed by: Wanita Chamberlain  12/07/2014   6:52 AM    =============================================================

## 2014-12-07 ENCOUNTER — Ambulatory Visit: Payer: BLUE CROSS/BLUE SHIELD | Admitting: Anesthesiology

## 2014-12-07 ENCOUNTER — Ambulatory Visit: Payer: Self-pay

## 2014-12-07 ENCOUNTER — Ambulatory Visit: Payer: BLUE CROSS/BLUE SHIELD | Admitting: Otolaryngology

## 2014-12-07 ENCOUNTER — Encounter
Admission: RE | Disposition: A | Discharge status: Home / Self Care | Payer: Self-pay | Source: Ambulatory Visit | Attending: Otolaryngology

## 2014-12-07 ENCOUNTER — Ambulatory Visit
Admission: RE | Admit: 2014-12-07 | Discharge: 2014-12-07 | Disposition: A | Discharge status: Home / Self Care | Payer: BLUE CROSS/BLUE SHIELD | Source: Ambulatory Visit | Attending: Otolaryngology | Admitting: Otolaryngology

## 2014-12-07 DIAGNOSIS — J36 Peritonsillar abscess: Secondary | ICD-10-CM | POA: Insufficient documentation

## 2014-12-07 DIAGNOSIS — J3501 Chronic tonsillitis: Secondary | ICD-10-CM | POA: Insufficient documentation

## 2014-12-07 DIAGNOSIS — J039 Acute tonsillitis, unspecified: Secondary | ICD-10-CM | POA: Insufficient documentation

## 2014-12-07 HISTORY — PX: TONSILLECTOMY: SHX5618

## 2014-12-07 SURGERY — TONSILLECTOMY
Anesthesia: Anesthesia General | Site: Mouth | Wound class: Contaminated

## 2014-12-07 MED ORDER — LIDOCAINE HCL (PF) 2 % IJ SOLN
INTRAMUSCULAR | Status: AC
Start: 2014-12-07 — End: ?
  Filled 2014-12-07: qty 5

## 2014-12-07 MED ORDER — PROPOFOL 10 MG/ML IV EMUL (WRAP)
INTRAVENOUS | Status: AC
Start: 2014-12-07 — End: ?
  Filled 2014-12-07: qty 20

## 2014-12-07 MED ORDER — ONDANSETRON HCL 4 MG/2ML IJ SOLN
INTRAMUSCULAR | Status: DC | PRN
Start: 2014-12-07 — End: 2014-12-07
  Administered 2014-12-07: 4 mg via INTRAVENOUS

## 2014-12-07 MED ORDER — MIDAZOLAM HCL 2 MG/2ML IJ SOLN
INTRAMUSCULAR | Status: AC
Start: 2014-12-07 — End: ?
  Filled 2014-12-07: qty 2

## 2014-12-07 MED ORDER — NEOSTIGMINE METHYLSULFATE 1 MG/ML IJ SOLN
INTRAMUSCULAR | Status: AC
Start: 2014-12-07 — End: ?
  Filled 2014-12-07: qty 10

## 2014-12-07 MED ORDER — HYDROMORPHONE HCL 1 MG/ML IJ SOLN
0.5000 mg | INTRAMUSCULAR | Status: DC | PRN
Start: 2014-12-07 — End: 2014-12-07

## 2014-12-07 MED ORDER — ONDANSETRON HCL 4 MG/2ML IJ SOLN
INTRAMUSCULAR | Status: AC
Start: 2014-12-07 — End: ?
  Filled 2014-12-07: qty 2

## 2014-12-07 MED ORDER — MEPERIDINE HCL 25 MG/ML IJ SOLN
25.0000 mg | INTRAMUSCULAR | Status: DC | PRN
Start: 2014-12-07 — End: 2014-12-07

## 2014-12-07 MED ORDER — FENTANYL CITRATE (PF) 50 MCG/ML IJ SOLN (WRAP)
INTRAMUSCULAR | Status: AC
Start: 2014-12-07 — End: ?
  Filled 2014-12-07: qty 2

## 2014-12-07 MED ORDER — DIPHENHYDRAMINE HCL 50 MG/ML IJ SOLN
12.5000 mg | Freq: Four times a day (QID) | INTRAMUSCULAR | Status: DC | PRN
Start: 2014-12-07 — End: 2014-12-07

## 2014-12-07 MED ORDER — LIDOCAINE 1% BUFFERED - CNR/OUTSOURCED
0.3000 mL | Freq: Once | INTRAMUSCULAR | Status: AC
Start: 2014-12-07 — End: 2014-12-07
  Administered 2014-12-07: 0.3 mL via INTRADERMAL

## 2014-12-07 MED ORDER — ROCURONIUM BROMIDE 50 MG/5ML IV SOLN
INTRAVENOUS | Status: DC | PRN
Start: 2014-12-07 — End: 2014-12-07
  Administered 2014-12-07: 30 mg via INTRAVENOUS

## 2014-12-07 MED ORDER — DEXAMETHASONE SODIUM PHOSPHATE 4 MG/ML IJ SOLN (WRAP)
INTRAMUSCULAR | Status: DC | PRN
Start: 2014-12-07 — End: 2014-12-07
  Administered 2014-12-07: 10 mg via INTRAVENOUS

## 2014-12-07 MED ORDER — LACTATED RINGERS IV SOLN
INTRAVENOUS | Status: DC
Start: 2014-12-07 — End: 2014-12-07
  Administered 2014-12-07: 1000 mL via INTRAVENOUS

## 2014-12-07 MED ORDER — LACTATED RINGERS IV SOLN
INTRAVENOUS | Status: DC
Start: 2014-12-07 — End: 2014-12-07

## 2014-12-07 MED ORDER — ONDANSETRON HCL 4 MG/2ML IJ SOLN
4.0000 mg | Freq: Once | INTRAMUSCULAR | Status: DC | PRN
Start: 2014-12-07 — End: 2014-12-07

## 2014-12-07 MED ORDER — LIDOCAINE HCL 2 % IJ SOLN
INTRAMUSCULAR | Status: DC | PRN
Start: 2014-12-07 — End: 2014-12-07
  Administered 2014-12-07: 100 mg via INTRAVENOUS

## 2014-12-07 MED ORDER — CEFAZOLIN SODIUM 1 G IJ SOLR
INTRAMUSCULAR | Status: DC
Start: 2014-12-07 — End: 2014-12-07
  Filled 2014-12-07: qty 2000

## 2014-12-07 MED ORDER — NEOSTIGMINE METHYLSULFATE 1 MG/ML IJ SOLN
INTRAMUSCULAR | Status: DC | PRN
Start: 2014-12-07 — End: 2014-12-07
  Administered 2014-12-07: 2.5 mg via INTRAVENOUS

## 2014-12-07 MED ORDER — CEFAZOLIN SODIUM 1 G IJ SOLR
2.0000 g | Freq: Once | INTRAMUSCULAR | Status: DC
Start: 2014-12-07 — End: 2014-12-07

## 2014-12-07 MED ORDER — FENTANYL CITRATE (PF) 50 MCG/ML IJ SOLN (WRAP)
INTRAMUSCULAR | Status: DC | PRN
Start: 2014-12-07 — End: 2014-12-07
  Administered 2014-12-07: 100 ug via INTRAVENOUS

## 2014-12-07 MED ORDER — GLYCOPYRROLATE 1 MG/5ML IJ SOLN
INTRAMUSCULAR | Status: AC
Start: 2014-12-07 — End: ?
  Filled 2014-12-07: qty 5

## 2014-12-07 MED ORDER — DEXAMETHASONE SOD PHOSPHATE PF 10 MG/ML IJ SOLN
INTRAMUSCULAR | Status: AC
Start: 2014-12-07 — End: ?
  Filled 2014-12-07: qty 1

## 2014-12-07 MED ORDER — ROCURONIUM BROMIDE 50 MG/5ML IV SOLN
INTRAVENOUS | Status: AC
Start: 2014-12-07 — End: ?
  Filled 2014-12-07: qty 5

## 2014-12-07 MED ORDER — CEFAZOLIN SODIUM 1 G IJ SOLR
2.0000 g | Freq: Once | INTRAMUSCULAR | Status: AC
Start: 2014-12-07 — End: 2014-12-07
  Administered 2014-12-07: 2 g via INTRAVENOUS

## 2014-12-07 MED ORDER — GLYCOPYRROLATE 0.2 MG/ML IJ SOLN
INTRAMUSCULAR | Status: DC | PRN
Start: 2014-12-07 — End: 2014-12-07
  Administered 2014-12-07: 0.4 mg via INTRAVENOUS

## 2014-12-07 MED ORDER — PROPOFOL INFUSION 10 MG/ML
INTRAVENOUS | Status: DC | PRN
Start: 2014-12-07 — End: 2014-12-07
  Administered 2014-12-07: 200 mg via INTRAVENOUS

## 2014-12-07 MED ORDER — MIDAZOLAM HCL 2 MG/2ML IJ SOLN
INTRAMUSCULAR | Status: DC | PRN
Start: 2014-12-07 — End: 2014-12-07
  Administered 2014-12-07: 2 mg via INTRAVENOUS

## 2014-12-07 SURGICAL SUPPLY — 19 items
CATH URETHRAL RED RUBBER 10F (Catheter Micellaneous) ×1
CATH URETHRAL RED RUBBER 10F (Catheter Miscellaneous) ×1 IMPLANT
GLOVE SURG BIOGEL SKNSNS SZ6.5 (Glove) ×2 IMPLANT
KIT HEAD AND NECK (Pack) ×2 IMPLANT
SOLUTION IRR 0.9% NACL 1000ML LF STRL (Irrigation Solutions) ×1
SOLUTION IRRIGATION 0.9% SODIUM CHLORIDE (Irrigation Solutions) ×1
SOLUTION IRRIGATION 0.9% SODIUM CHLORIDE 1000 ML PLASTIC POUR BOTTLE (Irrigation Solutions) ×1 IMPLANT
SOLUTION IV 0.9% NACL 500ML VFLX LF PLS (IV Solutions) ×1
SOLUTION IV 0.9% SODIUM CHLORIDE 500 ML (IV Solutions) ×1
SOLUTION IV 0.9% SODIUM CHLORIDE 500 ML PLASTIC CONTAINER (IV Solutions) ×1 IMPLANT
SOLUTION PRP 4% CHG 4OZ SCR CR EXDN ANMC (Prep) ×2 IMPLANT
TUBE NG PVC S SMP 14FR 48IN LF STRL (Tubing) ×2
TUBE OD14 FR INTEGRAL FUNNEL CONNECTOR 2 LUMEN 5 IN 1 ADAPTER (Tubing) ×1 IMPLANT
TUBING CONNECTING STERILE 10FT (Tubing) ×1
TUBING SUCTION ID3/16 IN L10 FT (Tubing) ×1
TUBING SUCTION ID3/16 IN L10 FT NONCONDUCTIVE STRAIGHT MALE FEMALE (Tubing) ×1 IMPLANT
WAND ELECTROSURGICAL 70 D INTEGRATE (Ablation) ×1
WAND ELECTROSURGICAL 70 D INTEGRATE CABLE SUCTION EVAC 70 PLASMA (Ablation) ×1 IMPLANT
WAND EVC 70 ESURG 70D PLSM ADND TNSL (Ablation) ×1

## 2014-12-07 NOTE — Anesthesia Postprocedure Evaluation (Signed)
Anesthesia Post Evaluation    Patient: Barry Gross    Procedure(s):  TONSILLECTOMY    Anesthesia type: general    Last Vitals:   Filed Vitals:    12/07/14 0828   BP: 122/86   Pulse: 88   Temp: 36.6 C (97.8 F)   Resp: 12   SpO2: 96%       Patient Location: Phase II PACU      Post Pain: Patient not complaining of pain, continue current therapy    Mental Status: awake and alert    Respiratory Function: tolerating room air    Cardiovascular: stable    Nausea/Vomiting: patient not complaining of nausea or vomiting    Hydration Status: adequate    Post Assessment: no apparent anesthetic complications, no evidence of recall and no reportable events          Anesthesia Qualified Clinical Data Registry    Central Line      CVC insertion : NO                                               Perioperative temperature management      General/neuraxial anesthesia > or = 60 minutes (excluding CABG) : YES              > Use of intraoperative active warming : YES              > Temperature > or = 36 degrees Centigrade (96.8 degrees Farenheit) during time span from 30 minutes before up to 15 minutes after anesthesia end time : YES      Administration of antibiotic prophylaxis      Age > or = 18, with IV access, with surgical procedure for which antibiotic prophylaxis indicated, and not on chronic antibiotics : YES              > Prophylactic antibiotics within 1 hour of incision (or fluroroquinolone/vancomycin within 2 hours of incision) : YES    Medication Administration      Ordering or administration of drug inconsistent with intended drug, dose, delivery or timing : NO      Dental loss/damage      Dental injury with administration of anesthesia : NO      Difficult intubation due to unrecognized difficult airway        Elective airway procedure including but not limited to: tracheostomy, fiberoptic bronchoscopy, rigid bronchoscopy; jet ventilation; or elective use of a device to facilitate airway management such as a  Glidescope : NO                > Unanticipated difficult intubation post pre-evaluation : NO      Aspiration of gastric contents        Aspiration of gastric contents : NO                    Surgical fire        Procedure requiring electrocautery/laser : YES                > Ignition/burning in invasive procedure location : NO      Immediate perioperative cardiac arrest        Cardiac arrest in OR or PACU : NO  Unplanned hospital admission        Unplanned hospital admission for initially intended outpatient anesthesia service : NO      Unplanned ICU admission        Unplanned ICU admission related to anesthesia occurring within 24 hours of induction or start of MAC : NO      Surgical case cancellation        Cancellation of procedure after care already started by anesthesia care team : NO      Post-anesthesia transfer of care checklist/protocol to PACU        Transfer from OR to PACU upon case conclusion : YES              > Use of PACU transfer checklist/protocol : YES     (Includes the key elements of: patient identification, responsible practitioner identification (PACU nurse or advanced practitioner), discussion of pertinent history and procedure course, intraoperative anesthetic management, post-procedure plans, acknowledgement/questions)    Post-anesthesia transfer of care checklist/protocol to ICU        Transfer from OR to ICU upon case conclusion : NO                    Post-operative nausea/vomiting risk protocol        Post-operative nausea/vomiting risk protocol : YES  Patient > or = 18 with care initiated by anesthesia team that has a risk factor screen for post-op nausea/vomiting (Includes male, hx PONV, or motion sickness, non-smoker, intended opioid administration for post-op analgesia.)    Anaphylaxis        Anaphylaxis during anesthesia services : NO    (Inclusive of any suspected transfusion reaction in association with blood-bank confirmed blood product incompatibility)               Jacquelynn Cree Mayo, 12/07/2014 8:28 AM

## 2014-12-07 NOTE — Brief Op Note (Signed)
BRIEF OP NOTE    Date Time: 12/07/2014 8:11 AM    Patient Name:   Barry Gross    Date of Operation:   12/07/2014    Providers Performing:   Surgeon(s):  Toney Rakes, MD    Assistant (s):    Operative Procedure:   Procedure(s):  TONSILLECTOMY    Preoperative Diagnosis:   Pre-Op Diagnosis Codes:     * Peritonsillar abscess [J36]    Postoperative Diagnosis:   SAME    Anesthesia:   General    Estimated Blood Loss:    less than 10 cc    Drains:   Drains: no    Specimens:        SPECIMENS (last 24 hours)      Pathology Specimens       12/07/14 0700             Additional Information    Clinical Information peritonsilla abscess       Send final report to: DR. Alona Bene       Specimen Information    Specimen Testing Required Routine Pathology       Specimen ID  [id number]       Specimen Description tonsils           Findings:   Scarring behind right tonsil    Complications:   none      Signed by: Toney Rakes, MD                                                                              [facility]

## 2014-12-07 NOTE — Interval H&P Note (Signed)
No interval change in history or physical.

## 2014-12-07 NOTE — Discharge Instructions (Addendum)
Dr Fox's discharge instructions     Start antibiotics tonight. Pain meds as needed. Start steroid pak tomorrow AM. No vigorous exercise for 2 weeks. Follow-up visit in 2 weeks.     Post Anesthesia Discharge Instructions    Although you may be awake and alert in the recovery room, small amounts of anesthetic remain in your system for about 24 hours.  You may feel tired and sleepy during this time.      You are advised to go directly home from the hospital.    Plan to stay at home and rest for the remainder of the day.    It is advisable to have someone with you at home for 24 hours after surgery.    Do not operate a motor vehicle, or any mechanical or electrical equipment for the next 24 hours.      Be careful when you are walking around, you may become dizzy.  The effects of anesthesia and/or medications are still present and drowsiness may occur    Do not consume alcohol, tranquilizers, sleeping medications, or any other non prescribed medication for the remainder of the day.    Diet:  begin with liquids, progress your diet as tolerated or as directed by your surgeon.  Nausea and vomiting may occur in the next 24 hours.

## 2014-12-07 NOTE — Op Note (Signed)
Procedure Date: 12/07/2014     Patient Type: A     SURGEON: Toney Rakes MD  ASSISTANT:       PREOPERATIVE DIAGNOSIS:  Chronic tonsillitis and recurrent right peritonsillar abscess.     POSTOPERATIVE DIAGNOSIS:  Chronic tonsillitis and recurrent right peritonsillar abscess.     TITLE OF PROCEDURE:  Tonsillectomy.     ANESTHESIA:  General endotracheal tube.     INDICATIONS FOR PROCEDURE:  The patient is a 19 year old male with a history of recurrent peritonsillar  abscess, 2 episodes in a 4 to 6-week time period despite undergoing  incision and drainage.  He, therefore, presents for a tonsillectomy.     DESCRIPTION OF PROCEDURE:  With the patient in supine position, after adequate general endotracheal  anesthesia, the bed was turned 90 degrees.  A shoulder roll and donut were  placed.  A Crowe-Davis mouth gag was put into position and opened to expose  the posterior oral cavity.  The patient was seen to have +3 to 4/4 tonsils  with prominent crypts.  The right tonsil was grasped with a curved Allis  clamp and retracted towards the midline.  The anterior pillar was incised  using the Coblator on the ablate mode on a setting of 8.  The tonsillar  capsule was identified, and the tonsil was dissected from the anterior to  the posterior pillar at the level of the capsule.  Hemostasis was obtained  in the tonsillar fossa using the Coblator on the coagulation mode on a  setting of 4.  The patient was noted to have scar tissue in the  posterosuperior aspect of the right peritonsillar fossa.  Attention was  then directed to the left tonsil, which was grasped with a curved Allis  clamp and retracted towards the midline.  The anterior pillar was incised  using the Coblator on the ablate mode on a setting of 8.  The tonsillar  capsule was identified, and the tonsil was dissected from the anterior to  the posterior pillar at the level of the capsule.  Hemostasis was obtained  in the tonsillar fossa using the Coblator on the  coagulation mode on a  setting of 4.  Estimated blood loss was less than 10 mL during the  procedure.  The patient tolerated the procedure well and was transferred to  recovery in satisfactory condition.           D:  12/07/2014 08:34 AM by Dr. Gentry Fitz A. Caryn Section, MD [phone])  T:  12/07/2014 12:12 PM by Alric Quan: [id number]) (Doc ID: [id number])

## 2014-12-08 LAB — LAB USE ONLY - HISTORICAL SURGICAL PATHOLOGY

## 2014-12-09 ENCOUNTER — Encounter: Payer: Self-pay | Admitting: Otolaryngology

## 2015-03-04 ENCOUNTER — Emergency Department: Payer: BLUE CROSS/BLUE SHIELD

## 2015-03-04 ENCOUNTER — Emergency Department
Admission: EM | Admit: 2015-03-04 | Discharge: 2015-03-04 | Disposition: A | Discharge status: Home / Self Care | Payer: BLUE CROSS/BLUE SHIELD | Attending: Emergency Medicine | Admitting: Emergency Medicine

## 2015-03-04 DIAGNOSIS — T162XXA Foreign body in left ear, initial encounter: Secondary | ICD-10-CM | POA: Insufficient documentation

## 2015-03-04 DIAGNOSIS — X58XXXA Exposure to other specified factors, initial encounter: Secondary | ICD-10-CM | POA: Insufficient documentation

## 2015-03-04 MED ORDER — MUPIROCIN 2 % EX OINT
TOPICAL_OINTMENT | Freq: Three times a day (TID) | CUTANEOUS | Status: AC
Start: 2015-03-04 — End: 2015-04-03

## 2015-03-04 NOTE — Discharge Instructions (Signed)
Foreign Body, NOS    You have been seen for a foreign object that you put somewhere in your body on purpose.    Putting foreign objects in your body on purpose is a very serious problem. You could hurt yourself seriously by causing an infection, or by harming your internal organs. You could also die.    Some symptoms of foreign body placement are:   Pain where the foreign body was placed.   Infection in the area where the foreign body was placed. This may cause redness, drainage of blood or pus, swelling and fever (temperature higher than 100.4F / 38C).   Injury to the internal organs near where the foreign body was placed.    Do not place foreign objects in your body in the future. This will prevent injuries.     YOU SHOULD SEEK MEDICAL ATTENTION IMMEDIATELY, EITHER HERE OR AT THE NEAREST EMERGENCY DEPARTMENT, IF ANY OF THE FOLLOWING OCCURS:   You have any other problems or concerns.   You have more pain.   You have any signs of infection. These include redness, pus, pain, swelling or fever (temperature greater than 100.4F / 38C).

## 2015-03-04 NOTE — ED Notes (Signed)
Earring stuck in left ear, removed by Dr. Guy Begin during triage

## 2015-03-04 NOTE — ED Provider Notes (Signed)
Physician/Midlevel provider first contact with patient: 03/04/15 1329         History     Chief Complaint   Patient presents with   . Foreign Body in Ear     HPI Comments: Earring backing embedded in the left ear lobe for weeks    Patient is a 19 y.o. male presenting with foreign body in ear. The history is provided by the patient. No language interpreter was used.   Foreign Body in Ear  This is a new problem. The current episode started 1 to 4 weeks ago. The problem occurs constantly. The problem has been unchanged. Associated symptoms include neck pain and numbness. Pertinent negatives include no abdominal pain, arthralgias, chest pain, chills, congestion, coughing, diaphoresis, fatigue, fever, headaches, joint swelling, myalgias, nausea, rash, sore throat, vomiting or weakness. Nothing aggravates the symptoms. He has tried nothing for the symptoms.            Past Medical History   Diagnosis Date   . Chest pain      HAS APPT WITH CARDIO-DR AZIZ-5/05-NOTES REQUESTED       Past Surgical History   Procedure Laterality Date   . Incision & drainage, tonsillar, peritonsillar abscess N/A 10/17/2014     Procedure: INCISION & DRAINAGE, TONSILLAR, PERITONSILLAR ABSCESS;  Surgeon: Toney Rakes, MD;  Location: [place] [facility];  Service: ENT;  Laterality: N/A;   . Tonsillectomy N/A 12/07/2014     Procedure: TONSILLECTOMY;  Surgeon: Toney Rakes, MD;  Location: [place] [facility];  Service: ENT;  Laterality: N/A;       History reviewed. No pertinent family history.    Social  Social History   Substance Use Topics   . Smoking status: Never Smoker    . Smokeless tobacco: Never Used   . Alcohol Use: No       .     No Known Allergies    Home Medications     Last Medication Reconciliation Action:  In Progress [name], RN 03/04/2015  1:40 PM          No Medications           Review of Systems   Constitutional: Negative for fever, chills, diaphoresis, activity change, appetite change, fatigue and unexpected weight  change.   HENT: Negative for congestion, ear discharge, ear pain, nosebleeds, postnasal drip, rhinorrhea, sinus pressure, sore throat, trouble swallowing and voice change.    Eyes: Negative for photophobia, pain, discharge, redness and itching.   Respiratory: Negative for cough, choking, chest tightness, shortness of breath, wheezing and stridor.    Cardiovascular: Negative for chest pain and palpitations.   Gastrointestinal: Negative for nausea, vomiting, abdominal pain, diarrhea, constipation and abdominal distention.   Genitourinary: Negative for frequency, hematuria, flank pain, decreased urine volume, enuresis and difficulty urinating.   Musculoskeletal: Positive for neck pain. Negative for myalgias, back pain, joint swelling, arthralgias, gait problem and neck stiffness.   Skin: Negative for color change, pallor and rash.        Earring backing embedded in the left ear lobe for weeks   Allergic/Immunologic: Negative for environmental allergies, food allergies and immunocompromised state.   Neurological: Positive for numbness. Negative for dizziness, seizures, syncope, weakness, light-headedness and headaches.   Hematological: Negative for adenopathy. Does not bruise/bleed easily.   Psychiatric/Behavioral: Negative for behavioral problems and agitation. The patient is not nervous/anxious.        Physical Exam    BP: 114/55 mmHg, Heart Rate: 72, Temp:  98.4 F (36.9 C), Resp Rate: 20, SpO2: 98 %, Weight: 76.5 kg    Physical Exam   Constitutional: He appears well-developed and well-nourished.   HENT:   Head: Normocephalic and atraumatic.   Right Ear: External ear normal.   Left Ear: External ear normal.   Mouth/Throat: No oropharyngeal exudate.   Eyes: Conjunctivae are normal. Pupils are equal, round, and reactive to light. Right eye exhibits no discharge. No scleral icterus.   Neck: Normal range of motion. No JVD present. No tracheal deviation present. No thyromegaly present.   Cardiovascular: Normal rate,  regular rhythm, normal heart sounds and intact distal pulses.  Exam reveals no gallop and no friction rub.    No murmur heard.  Pulmonary/Chest: Effort normal. No respiratory distress. He has no wheezes. He has no rales. He exhibits no tenderness.   Abdominal: Soft. He exhibits no distension and no mass. There is no tenderness. There is no rebound and no guarding.   Musculoskeletal: Normal range of motion. He exhibits no edema or tenderness.   Lymphadenopathy:     He has no cervical adenopathy.   Neurological: He is alert. He displays normal reflexes. No cranial nerve deficit. He exhibits normal muscle tone. Coordination normal.   Skin: Skin is warm. No rash noted. He is not diaphoretic. No erythema. No pallor.   Earring backing embedded in left earlobe for weeks   Psychiatric: He has a normal mood and affect. His behavior is normal. Thought content normal.         MDM and ED Course     ED Medication Orders     None             MDM  Number of Diagnoses or Management Options  Foreign body in left ear, initial encounter:   Diagnosis management comments: 1:48 PM  SaO2 97-99%  normal    Additional Social History  The patient lives with family, goes to school, and does not smoke cigarettes or is not exposed to cigarette smoke.    Phillis Knack. Aaren Atallah MD, have reviewed old medical records if available and needed including previous emergency department visits, hospital admissions, laboratories, radiologic studies, EKG's, and previous treatment plans in the care of this patient.    Nurse's notes, vital signs, and past medical history as well as social history reviewed at length    Arm body removal                Foreign Body  Date/Time: 03/04/2015 1:48 PM  Performed by: Ames Coupe  Authorized by: Sharyne Richters D  Consent: Verbal consent obtained.  Patient identity confirmed: verbally with patient and hospital-assigned identification number  Body area: ear (Earlobe, left)  Anesthesia method: None.  Patient sedated:  no  Patient restrained: no  Localization method: Earring backing removed bluntly with forceps manipulation.  Complexity: simple  1 objects recovered.  Objects recovered: Earring backing  Post-procedure assessment: foreign body removed        Clinical Impression & Disposition     Clinical Impression  Final diagnoses:   Foreign body in left ear, initial encounter        ED Disposition     Discharge Barry Gross discharge to home/self care.    Condition at disposition: Stable             Discharge Medication List as of 03/04/2015  1:47 PM      START taking these medications    Details   mupirocin (  BACTROBAN) 2 % ointment Apply topically 3 (three) times daily. Apply to the rash three times a day after cleaning with soap and water, Starting 03/04/2015, Until Mon 04/03/15, Normal                         Sharyne Richters D, MD  03/04/15 1505

## 2015-03-11 ENCOUNTER — Other Ambulatory Visit: Payer: Self-pay | Admitting: Family

## 2015-09-08 ENCOUNTER — Other Ambulatory Visit: Payer: Self-pay

## 2015-09-08 MED ORDER — LAMOTRIGINE 25 MG PO TABS: ORAL_TABLET | ORAL | Status: DC

## 2015-09-25 ENCOUNTER — Emergency Department (HOSPITAL_COMMUNITY): Payer: Medicaid Other

## 2015-09-25 ENCOUNTER — Encounter (HOSPITAL_COMMUNITY): Payer: Self-pay | Admitting: Emergency Medicine

## 2015-09-25 ENCOUNTER — Emergency Department (HOSPITAL_COMMUNITY)
Admission: EM | Admit: 2015-09-25 | Discharge: 2015-09-25 | Disposition: A | Discharge status: Home / Self Care | Payer: Medicaid Other | Attending: Emergency Medicine | Admitting: Emergency Medicine

## 2015-09-25 DIAGNOSIS — Y999 Unspecified external cause status: Secondary | ICD-10-CM | POA: Insufficient documentation

## 2015-09-25 DIAGNOSIS — Y9389 Activity, other specified: Secondary | ICD-10-CM | POA: Insufficient documentation

## 2015-09-25 DIAGNOSIS — Z79899 Other long term (current) drug therapy: Secondary | ICD-10-CM | POA: Insufficient documentation

## 2015-09-25 DIAGNOSIS — G40909 Epilepsy, unspecified, not intractable, without status epilepticus: Secondary | ICD-10-CM | POA: Diagnosis not present

## 2015-09-25 DIAGNOSIS — S43015A Anterior dislocation of left humerus, initial encounter: Secondary | ICD-10-CM | POA: Insufficient documentation

## 2015-09-25 DIAGNOSIS — Y9289 Other specified places as the place of occurrence of the external cause: Secondary | ICD-10-CM | POA: Diagnosis not present

## 2015-09-25 DIAGNOSIS — X501XXA Overexertion from prolonged static or awkward postures, initial encounter: Secondary | ICD-10-CM | POA: Diagnosis not present

## 2015-09-25 DIAGNOSIS — S4992XA Unspecified injury of left shoulder and upper arm, initial encounter: Secondary | ICD-10-CM | POA: Diagnosis present

## 2015-09-25 MED ORDER — PROPOFOL 10 MG/ML IV BOLUS
1.0000 mg/kg | Freq: Once | INTRAVENOUS | Status: AC
  Administered 2015-09-25: 68 mg via INTRAVENOUS
  Filled 2015-09-25: qty 20

## 2015-09-25 MED ORDER — OXYCODONE-ACETAMINOPHEN 5-325 MG PO TABS
1.0000 | ORAL_TABLET | Freq: Three times a day (TID) | ORAL | Status: DC | PRN
Start: 2015-09-25 — End: 2015-10-17

## 2015-09-25 MED ORDER — NALOXONE HCL 2 MG/2ML IJ SOSY
PREFILLED_SYRINGE | INTRAMUSCULAR | Status: AC
  Filled 2015-09-25: qty 2

## 2015-09-25 MED ORDER — PROPOFOL 10 MG/ML IV BOLUS
70.0000 mg | Freq: Once | INTRAVENOUS | Status: AC
  Administered 2015-09-25: 70 mg via INTRAVENOUS

## 2015-09-25 MED ORDER — FLUMAZENIL 0.5 MG/5ML IV SOLN
INTRAVENOUS | Status: AC
  Filled 2015-09-25: qty 5

## 2015-09-25 NOTE — ED Provider Notes (Signed)
Patient reports left shoulder pain last night. While abducting his shoulder. Pain is worse when he attempts to move the shoulder He denies other complaint. On exam left upper extremity deformities shoulder. Radial pulse 2+. Limited range of motion secondary to pain. Shoulder x-ray reviewed by me.  Procedure: Closed reduction of left shoulder with procedural sedation Closed reduction of left shoulder was performed by me Patient is ASA category 2. Last oral intake was 6 PM yesterday. A t 4:45 PM procedure began. Time out performed. Patient identifiers were bracelet with medical record number and verbally confirmation with patient. Proper equipment and x-rays were available for review 4:46 PM he was administered propofol 2 mg/kg which allowed for adequate sedation in order to continue with procedure. He was moderately sedated. His left shoulder was reduced by me with an assistant using scapular rotation technique. He was placed in a shoulder immobilizer. Radial pulse 2+ post reduction At 5:02 PM he is resting comfortably with eyes closed. Answers questions appropriately. There were no complications patient tolerated procedure well. Blood loss none X-rays viewed by me  Doug SouSam Armelia Penton, MD 09/25/15 [phone]

## 2015-09-25 NOTE — Progress Notes (Signed)
Orthopedic Tech Progress Note Patient Details:  Benjamin MadeiraChristopher Wagner 1995/09/28 [id number]  Ortho Devices Type of Ortho Device: Sling immobilizer Ortho Device/Splint Location: LUE Ortho Device/Splint Interventions: Ordered, Application   Jennye MoccasinHughes, [name] 09/25/2015, 5:44 PM

## 2015-09-25 NOTE — ED Provider Notes (Signed)
CSN: [id number]     Arrival date & time 09/25/15  1128 History  By signing my name below, I, Benjamin Wagner, attest that this documentation has been prepared under the direction and in the presence of non-physician practitioner, Audry Piliyler Dafney Farler, PA-C. Electronically Signed: Linna Darnerussell Wagner, Scribe. 09/25/2015. 1:31 PM.    Chief Complaint  Patient presents with  . Shoulder Pain    The history is provided by the patient and a parent. No language interpreter was used.    HPI Comments: Benjamin Wagner is a 20 y.o. male with h/o seizures who presents to the Emergency Department complaining of sudden onset, constant, aching, 4/10 left shoulder pain since last night. Pt notes that he was stretching in bed last night and felt significant pain in his left shoulder. He notes severe pain exacerbation with left shoulder movement as well as mild pain exacerbation with palpation to his left shoulder. Pt has not taken any medications for his pain. Pt has no h/o shoulder dislocations. He denies right shoulder pain, nausea, vomiting, diarrhea, chest pain, fever, or any other associated symptoms.  Past Medical History  Diagnosis Date  . Seizures [facility](HCC)    Past Surgical History  Procedure Laterality Date  . Circumcision  1996   History reviewed. No pertinent family history. Social History  Substance Use Topics  . Smoking status: Passive Smoke Exposure - Never Smoker  . Smokeless tobacco: Never Used     Comment: Mom smokes   . Alcohol Use: No    Review of Systems  A complete 10 system review of systems was obtained and all systems are negative except as noted in the HPI and PMH.    Allergies  Review of patient's allergies indicates no known allergies.  Home Medications   Prior to Admission medications   Medication Sig Start Date End Date Taking? Authorizing Provider  lamoTRIgine (LAMICTAL) 25 MG tablet Take 1 tablet by mouth every morning, 2 tablets at midday and 3 tablets every night at bedtime  09/08/15   Deetta PerlaWilliam H Hickling, MD   BP 141/86 mmHg  Pulse 111  Temp(Src) 98.6 F (37 C) (Oral)  Resp 20  SpO2 99%   Physical Exam  Constitutional: He is oriented to person, place, and time. He appears well-developed and well-nourished. No distress.  HENT:  Head: Normocephalic and atraumatic.  Eyes: EOM are normal. Pupils are equal, round, and reactive to light.  Neck: Normal range of motion. Neck supple. No tracheal deviation present.  Cardiovascular: Normal rate and normal heart sounds.   Pulmonary/Chest: Effort normal and breath sounds normal. No respiratory distress.  Musculoskeletal: Normal range of motion.  Obvious anterior dislocation of left shoulder. TTP. Distal pulses intact. Cap refill <2 sec. Motor/sensory intact.   Neurological: He is alert and oriented to person, place, and time.  Skin: Skin is warm and dry.  Psychiatric: He has a normal mood and affect. His behavior is normal.  Nursing note and vitals reviewed.   ED Course  Procedures (including critical care time)  DIAGNOSTIC STUDIES: Oxygen Saturation is 99% on RA, normal by my interpretation.    COORDINATION OF CARE: 1:31 PM Discussed treatment plan with pt at bedside and pt agreed to plan.   Labs Review Labs Reviewed - No data to display  Imaging Review Dg Shoulder Left  09/25/2015  CLINICAL DATA:  Left shoulder pain with decreased range of motion since last night. EXAM: LEFT SHOULDER - 2+ VIEW COMPARISON:  None. FINDINGS: There is anterior dislocation of the left humeral  head with a Hill-Sachs lesion. No visible fracture of the glenoid. AC joint is normal. IMPRESSION: Anterior dislocation of the left humeral head. Electronically Signed   By: Francene Boyers M.D.   On: 09/25/2015 13:13   Dg Shoulder Left Port  09/25/2015  CLINICAL DATA:  LEFT shoulder dislocation post reduction EXAM: LEFT SHOULDER - 1 VIEW COMPARISON:  Portable exam 1701 hours compared to earlier study of 09/25/2015 FINDINGS: Glenohumeral  alignment now appears normal. AC joint alignment normal. Osseous mineralization normal. No acute fracture, dislocation or bone destruction. IMPRESSION: Reduction of previously identified LEFT glenohumeral dislocation. Electronically Signed   By: Ulyses Southward M.D.   On: 09/25/2015 17:20   I have personally reviewed and evaluated these images and lab results as part of my medical decision-making.   EKG Interpretation None      MDM  I have reviewed and evaluated the relevant imaging studies. I have reviewed the relevant previous healthcare records. I obtained HPI from historian. Patient discussed with supervising physician  ED Course:  Assessment: Pt is a 19yM with hx seziures who presents with anterior left shoulder dislocation. On exam, pt in NAD. Nontoxic/nonseptic appearing. Resting comfortably. VSS. Afebrile. Lungs CTA. Heart RRR. Obvious anterior dislocation of left shoulder. TTP. Distal pulses intact. Cap refill <2sec. Motor/sensory intact. Imaging anterior dislocation of left humeral head. No previous dislocations in the past. Plan is to relocate shoulder under conscious sedation. Re imaging showed successful reduction. Will have pt follow up with Orthopedics as soon as possible.  At time of discharge, Patient is in no acute distress. Vital Signs are stable. Patient is able to ambulate. Patient able to tolerate PO.   Disposition/Plan:  DC Home Additional Verbal discharge instructions given and discussed with patient.  Pt Instructed to f/u with Orthopedics Return precautions given Pt acknowledges and agrees with plan  Supervising Physician Doug Sou, MD   Final diagnoses:  Anterior shoulder dislocation, left, initial encounter    I personally performed the services described in this documentation, which was scribed in my presence. The recorded information has been reviewed and is accurate.     Audry Pili, PA-C 09/25/15 1732  Doug Sou, MD 09/25/15 1757

## 2015-09-25 NOTE — ED Notes (Signed)
Pt reports woke this AM with left shoulder pain. Pt guarding with left upper extremity and decreased ROM noted. Distal circulation intact. Denies recent falls or injury. No meds PTA.

## 2015-09-25 NOTE — Discharge Instructions (Signed)
Please read and follow all provided instructions.  Your diagnoses today include:  1. Anterior shoulder dislocation, left, initial encounter    Tests performed today include:  Vital signs. See below for your results today.   Medications prescribed:   Take any medication as prescribed  You have been prescribed a narcotic medication on an "as needed" basis. Take only as prescribed. Do not drive, operate any machinery or make any important decisions while taking this medication as it is sedating. It may cause constipation take over the counter stool softeners or add fiber to your diet to treat this (Metamucil, Psyllium Fiber, Colace, Miralax) Further refills will need to be obtained from your primary care doctor and will not be prescribed through the Emergency Department. You will test positive on most drug tests while taking this medciation.   Home care instructions:  Follow any educational materials contained in this packet.  Follow-up instructions: Please follow-up with Orthopedic Surgery (see above) as soon as possible for a visit   Return instructions:   Please return to the Emergency Department if you do not get better, if you get worse, or new symptoms OR  - Fever (temperature greater than 101.40F)  - Bleeding that does not stop with holding pressure to the area    -Severe pain (please note that you may be more sore the day after your accident)  - Chest Pain  - Difficulty breathing  - Severe nausea or vomiting  - Inability to tolerate food and liquids  - Passing out  - Skin becoming red around your wounds  - Change in mental status (confusion or lethargy)  - New numbness or weakness     Please return if you have any other emergent concerns.  Additional Information:  Your vital signs today were: BP 141/86 mmHg   Pulse 111   Temp(Src) 98.6 F (37 C) (Oral)   Resp 20   SpO2 99% If your blood pressure (BP) was elevated above 135/85 this visit, please have this repeated by  your doctor within one month. ---------------

## 2015-09-25 NOTE — ED Notes (Signed)
Provider at bedside

## 2015-09-25 NOTE — ED Notes (Signed)
Wasted propofol 62 mg witnessed by Goodyear TireMillie RN.

## 2015-10-17 ENCOUNTER — Ambulatory Visit (INDEPENDENT_AMBULATORY_CARE_PROVIDER_SITE_OTHER): Payer: Medicaid Other | Admitting: Family

## 2015-10-17 ENCOUNTER — Encounter: Payer: Self-pay | Admitting: Family

## 2015-10-17 VITALS — BP 110/70 | HR 84 | Ht 68.5 in | Wt 175.8 lb

## 2015-10-17 DIAGNOSIS — F70 Mild intellectual disabilities: Secondary | ICD-10-CM

## 2015-10-17 DIAGNOSIS — G40309 Generalized idiopathic epilepsy and epileptic syndromes, not intractable, without status epilepticus: Secondary | ICD-10-CM | POA: Diagnosis not present

## 2015-10-17 MED ORDER — LAMOTRIGINE 25 MG PO TABS: ORAL_TABLET | ORAL | Status: DC

## 2015-10-17 NOTE — Patient Instructions (Signed)
Continue taking Lamotrigine as you have been taking it. Let me know if you have any seizures.   Please consider signing up for MyChart access. This is an online patient portal to your electronic medical record.  Please plan to return for follow up in 1 year or sooner if needed.

## 2015-10-17 NOTE — Progress Notes (Signed)
Patient: Benjamin Wagner MRN: [id number] Sex: male DOB: 14-Aug-1995  Provider: Elveria Rising, NP Location of Care: [facility] Child Neurology  Note type: Routine return visit  History of Present Illness: Referral Source: [facility] History from: patient, referring office, [facility] chart and mother Chief Complaint: Tonic clonic seizures   Benjamin Wagner is a 20 y.o. 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 He was last seen on September 13, 2014. Benjamin Wagner is taking and tolerating Lamotrigine, which has given him good seizure control. His last seizure occurred in May 2013.   Since he was last seen, Benjamin Wagner has been generally healthy. He was seen in the ER in March for a disclocated left shoulder that occurred while stretching. He was seen in follow up by orthopedics and denies any pain or there problem with the shoulder now.   Benjamin Wagner graduated from high school with a certificate in 2015. His mother said that he received some vocational training while in school but that he has been unable to get a job. Benjamin Wagner says that he goes to [facility] to spend time when he is not at home. Benjamin Wagner's mother oversees his medication administration and says that he needs some occasional reminders for activities of daily living. There are no concerns with his behavior.  Neither Benjamin Wagner nor his mother have other health concerns for him today other than previously mentioned.  Review of Systems: Please see the HPI for neurologic and other pertinent review of systems. Otherwise, the following systems are noncontributory including constitutional, eyes, ears, nose and throat, cardiovascular, respiratory, gastrointestinal, genitourinary, musculoskeletal, skin, endocrine, hematologic/lymph, allergic/immunologic and psychiatric.   Past Medical History  Diagnosis Date  . Seizures (HCC)    Hospitalizations: No., Head Injury:  No., Nervous System Infections: No., Immunizations up to date: Yes.   Past Medical History Comments: EEG on August 02, 2005, showed frequent three to four hertz regularly contoured spike in slow wave of activity that happened in one and a half to two-second intervals multiple times and also in single burst. This occurred at rest, with hyperventilation and has further myoclonic responses. He had a seizure in April 2007 with left focal motor activity with secondary generalization. On February 24, 2008, the patient had repeat EEG, which again showed frequent burst of generalized four hertz spike and slow wave activity in single bursts and in clusters lasting up to two seconds in duration. The episodes did not occur during photic stimulation and were exacerbated by hyperventilation. His background was otherwise normal. He had a repeat EEG on October 08, 2012, that showed three episodes of generalized regularly contoured spike and slow wave activity followed by two episodes of four hertz regularly contoured spike and slow wave activity that was frontally prominent, but generalized. The episodes were lasted for a second or less. Background activity was otherwise normal.  The patient was initially placed on [facility] because of his abnormal EEG and the potential for having further seizures. In 2009, he was on 25 mg twice daily. He had a normal examination. Plans were made to perform an EEG at [facility] and to consider tapering and discontinuing the medications. He was lost to follow up and when seen again was taking Lamotrigine at a dose of 25 mg in the morning and 50 mg in the afternoon and 75 mg at nighttime.  Other medical problems for this young man included acne, hypercholesterolemia, and intellectual disability. For reasons that are unclear, he had been placed  on metformin, though he was not obese. He had normal blood sugar and hemoglobin A1c of 5.8.  In high school, he was in the self-maintained  life skills class and participated in [place]. He has an older brother with cognitive impairment and autism. His IQ has been measured as full scale IQ 41 and verbal 45.    Surgical History Past Surgical History  Procedure Laterality Date  . Circumcision  1996    Family History family history is not on file. Family History is otherwise negative for migraines, seizures, cognitive impairment, blindness, deafness, birth defects, chromosomal disorder, autism.  Social History Social History   Social History  . Marital Status: Single    Spouse Name: N/A  . Number of Children: N/A  . Years of Education: N/A   Social History Main Topics  . Smoking status: Passive Smoke Exposure - Never Smoker  . Smokeless tobacco: Never Used     Comment: Mom smokes   . Alcohol Use: No  . Drug Use: No  . Sexual Activity: No   Other Topics Concern  . None   Social History Narrative   Benjamin Wagner graduated from [facility] [employer] in 2015. He enjoys playing computer games.   Lives with his mother.     Allergies No Known Allergies  Physical Exam BP 110/70 mmHg  Pulse 84  Ht 5' 8.5" (1.74 m)  Wt 175 lb 12.8 oz (79.742 kg)  BMI 26.34 kg/m2 General: alert, well developed, well nourished young man , in no acute distress, brown hair, brown eyes, right handedness  Head: normocephalic, no dysmorphic features  Ears, Nose and Throat: Otoscopic: Tympanic membranes normal. Pharynx: oropharynx is pink without exudates or tonsillar hypertrophy.  Neck: supple, full range of motion, no cranial or cervical bruits  Respiratory: auscultation clear  Cardiovascular: no murmurs, pulses are normal  Musculoskeletal: no skeletal deformities or apparent scoliosis  Skin: no rashes or neurocutaneous lesions, mild facial acne  Neurologic Exam  Mental Status: alert; oriented to person, place and year; knowledge is below normal for age; language is acceptable, he can name objects and follow commands. His  speech is dysarthric but intelligible.  Cranial Nerves: visual fields are full to double simultaneous stimuli; extraocular movements are full and conjugate; pupils are around reactive to light; funduscopic examination shows sharp disc margins with normal vessels; symmetric facial strength; midline tongue and uvula; hearing is normal and symmetric. Motor: Normal strength, tone and mass; good fine motor movements; no pronator drift.  Sensory: intact responses to touch and temperature Coordination: good finger-to-nose, rapid repetitive alternating movements and finger apposition  Gait and Station: normal gait and station: patient is able to walk on heels, toes and tandem without difficulty; balance is adequate; Romberg exam is negative; Gower response is negative  Reflexes: symmetric and diminished bilaterally; no clonus; bilateral flexor plantar responses.  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 Coley has neither had nor required imaging or lab studies since the last visit other than x-ray studies done in the ER in March for a dislocated shoulder. He is taking and tolerating Lamotrigine and has remained seizure free since 2013. He will continue on this medication for now. I will make no changes in his plan of care and will see him in follow up in 1 year or sooner if needed.  The medication list was reviewed and reconciled.  No changes were made in the prescribed medications today.  A complete medication list was provided to the patient and his mother.    Medication List       This list is accurate as of: 10/17/15  3:13 PM.  Always use your most recent med list.               lamoTRIgine 25 MG tablet  Commonly known as:  [facility]  Take 1 tablet by mouth every morning, 2 tablets at midday and 3 tablets every night at bedtime        Total time spent with  the patient was 20 minutes, of which 50% or more was spent in counseling and coordination of care.   Benjamin Risingina Chasitee Zenker

## 2016-03-22 ENCOUNTER — Other Ambulatory Visit: Payer: Self-pay

## 2016-04-29 ENCOUNTER — Other Ambulatory Visit: Payer: Self-pay | Admitting: Family

## 2016-04-29 DIAGNOSIS — G40309 Generalized idiopathic epilepsy and epileptic syndromes, not intractable, without status epilepticus: Secondary | ICD-10-CM

## 2016-05-29 ENCOUNTER — Other Ambulatory Visit: Payer: Self-pay | Admitting: Family

## 2016-05-29 DIAGNOSIS — G40309 Generalized idiopathic epilepsy and epileptic syndromes, not intractable, without status epilepticus: Secondary | ICD-10-CM

## 2016-12-03 ENCOUNTER — Other Ambulatory Visit: Payer: Self-pay | Admitting: Family

## 2016-12-03 ENCOUNTER — Telehealth (INDEPENDENT_AMBULATORY_CARE_PROVIDER_SITE_OTHER): Payer: Self-pay | Admitting: *Deleted

## 2016-12-03 DIAGNOSIS — G40309 Generalized idiopathic epilepsy and epileptic syndromes, not intractable, without status epilepticus: Secondary | ICD-10-CM

## 2016-12-03 NOTE — Telephone Encounter (Signed)
Please continue to attempt to reach patient in regards to scheduling appt for future rx refills. Thanks  Diplomatic Services operational officerabiola Cardenas Certified Medical Assistant [facility] Phone:[phone] Fax: [phone]

## 2016-12-03 NOTE — Telephone Encounter (Signed)
Called patient's family and left a voicemail for them to return my call regarding scheduling.   

## 2016-12-30 ENCOUNTER — Other Ambulatory Visit: Payer: Self-pay | Admitting: Family

## 2016-12-30 DIAGNOSIS — G40309 Generalized idiopathic epilepsy and epileptic syndromes, not intractable, without status epilepticus: Secondary | ICD-10-CM

## 2016-12-30 NOTE — Telephone Encounter (Signed)
Called patient's family and left a voicemail for them to return my call regarding scheduling.   

## 2017-01-29 ENCOUNTER — Other Ambulatory Visit: Payer: Self-pay | Admitting: Pediatrics

## 2017-01-29 DIAGNOSIS — G40309 Generalized idiopathic epilepsy and epileptic syndromes, not intractable, without status epilepticus: Secondary | ICD-10-CM

## 2017-03-03 ENCOUNTER — Other Ambulatory Visit: Payer: Self-pay | Admitting: Pediatrics

## 2017-03-03 DIAGNOSIS — G40309 Generalized idiopathic epilepsy and epileptic syndromes, not intractable, without status epilepticus: Secondary | ICD-10-CM

## 2017-03-03 NOTE — Telephone Encounter (Signed)
Please continue to reach out to family to have an appt scheduled for future refills.   Lorre Munroe Certified Medical Assistant [facility] [phone] Fax: [phone]

## 2017-03-03 NOTE — Telephone Encounter (Signed)
L/M informing Adela Lank (mom) that it was very important to call back and schedule a follow up for further refills

## 2017-03-27 ENCOUNTER — Other Ambulatory Visit: Payer: Self-pay | Admitting: Family

## 2017-03-27 DIAGNOSIS — G40309 Generalized idiopathic epilepsy and epileptic syndromes, not intractable, without status epilepticus: Secondary | ICD-10-CM

## 2017-03-31 ENCOUNTER — Other Ambulatory Visit (INDEPENDENT_AMBULATORY_CARE_PROVIDER_SITE_OTHER): Payer: Self-pay | Admitting: Family

## 2017-03-31 DIAGNOSIS — G40309 Generalized idiopathic epilepsy and epileptic syndromes, not intractable, without status epilepticus: Secondary | ICD-10-CM

## 2017-03-31 MED ORDER — LAMOTRIGINE 25 MG PO TABS: ORAL_TABLET | ORAL | 0 refills | Status: DC

## 2017-03-31 NOTE — Telephone Encounter (Signed)
°  Who's calling (name and relationship to patient) : Adela Lank -mother  Best contact number: [phone]  Provider they see: Inetta Fermo  Reason for call: Mother called in to schedule follow-up appt and to request refill on Lamictal.     PRESCRIPTION REFILL ONLY  Name of prescription: Lamictal   Pharmacy: [facility] on [address]

## 2017-03-31 NOTE — Telephone Encounter (Signed)
Rx has been printed and placed on Dr. Hickling's desk 

## 2017-04-02 ENCOUNTER — Encounter (INDEPENDENT_AMBULATORY_CARE_PROVIDER_SITE_OTHER): Payer: Self-pay | Admitting: Family

## 2017-04-02 ENCOUNTER — Ambulatory Visit (INDEPENDENT_AMBULATORY_CARE_PROVIDER_SITE_OTHER): Payer: Medicaid Other | Admitting: Family

## 2017-04-02 VITALS — BP 110/78 | HR 82 | Ht 68.25 in | Wt 177.6 lb

## 2017-04-02 DIAGNOSIS — F70 Mild intellectual disabilities: Secondary | ICD-10-CM

## 2017-04-02 DIAGNOSIS — G40309 Generalized idiopathic epilepsy and epileptic syndromes, not intractable, without status epilepticus: Secondary | ICD-10-CM

## 2017-04-02 MED ORDER — LAMOTRIGINE 25 MG PO TABS: ORAL_TABLET | ORAL | 5 refills | Status: DC

## 2017-04-02 NOTE — Patient Instructions (Signed)
Thank you for coming in today. Instructions for you until your next appointment are as follows: 1. Continue taking the Lamotrigiine as you have been taking it. I sent in refills to your pharmacy.  2. Let me know if you have any seizures.  3. Please return for follow up in 1 year or sooner if needed.  4. Please sign up for MyChart if you have not done so.

## 2017-04-02 NOTE — Progress Notes (Signed)
Patient: Benjamin Wagner MRN: [id number] Sex: male DOB: [birth year]  Provider: Elveria Rising, NP Location of Care: [facility] Child Neurology  Note type: Routine return visit  History of Present Illness: Referral Source: Ivory Broad MD History from: mother and patient Chief Complaint: Seizures  Benjamin Wagner is a 21 y.o. 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 He was last seen October 17, 2015. Benjamin Wagner is taking and tolerating Lamotrigine for his seizure disorder and has remained seizure free since May 2013. He has had no side effects from the medication.   Benjamin Wagner has been unable to find employment. He is at home with his mother during the day but sometimes goes to the [employer] to spend time. He will also help his mother around the house. Benjamin Wagner's mother says that he needs reminders for medication administration and for some other activities of daily living. There are no concerns for his behavior. He enjoys playing some video games.   Benjamin Wagner has been generally health since he was last seen. Neither he nor his mother have other health concerns for him today other than previously mentioned.   Review of Systems: Please see the HPI for neurologic and other pertinent review of systems. Otherwise, all other systems were reviewed and were negative.    Past Medical History:  Diagnosis Date  . Seizures (HCC)    Hospitalizations: No., Head Injury: No., Nervous System Infections: No., Immunizations up to date: yes Past Medical History Comments: EEG on August 02, 2005, showed frequent three to four hertz regularly contoured spike in slow wave of activity that happened in one and a half to two-second intervals multiple times and also in single burst. This occurred at rest, with hyperventilation and has further myoclonic responses. He had a seizure in April 2007 with left focal motor activity with secondary  generalization. On February 24, 2008, the patient had repeat EEG, which again showed frequent burst of generalized four hertz spike and slow wave activity in single bursts and in clusters lasting up to two seconds in duration. The episodes did not occur during photic stimulation and were exacerbated by hyperventilation. His background was otherwise normal. He had a repeat EEG on October 08, 2012, that showed three episodes of generalized regularly contoured spike and slow wave activity followed by two episodes of four hertz regularly contoured spike and slow wave activity that was frontally prominent, but generalized. The episodes were lasted for a second or less. Background activity was otherwise normal.  The patient was initially placed on Lamictal because of his abnormal EEG and the potential for having further seizures. In 2009, he was on 25 mg twice daily. He had a normal examination. Plans were made to perform an EEG at [facility] and to consider tapering and discontinuing the medications. He was lost to follow up and when seen again was taking Lamotrigine at a dose of 25 mg in the morning and 50 mg in the afternoon and 75 mg at nighttime.  Other medical problems for this young man included acne, hypercholesterolemia, and intellectual disability. For reasons that are unclear, he had been placed on metformin, though he was not obese. He had normal blood sugar and hemoglobin A1c of 5.8.  In high school, he was in the self-maintained life skills class and participated in [place]. He has an older brother with cognitive impairment and autism. His IQ has been measured as full scale IQ 41 and verbal 45.    Surgical History Past  Surgical History:  Procedure Laterality Date  . CIRCUMCISION  1996    Family History family history is not on file. Family History is otherwise negative for migraines, seizures, cognitive impairment, blindness, deafness, birth defects, chromosomal disorder,  autism.  Social History Social History   Social History  . Marital status: Single    Spouse name: N/A  . Number of children: N/A  . Years of education: N/A   Social History Main Topics  . Smoking status: Passive Smoke Exposure - Never Smoker  . Smokeless tobacco: Never Used     Comment: Mom smokes   . Alcohol use No  . Drug use: No  . Sexual activity: No   Other Topics Concern  . None   Social History Narrative   Benjamin Wagner graduated from [employer] in 2015. He enjoys playing computer games.   Lives with his mother.    Wants to take some classes at [employer]    Allergies No Known Allergies  Physical Exam BP 110/78   Pulse 82   Ht 5' 8.25" (1.734 m)   Wt 177 lb 9.6 oz (80.6 kg)   BMI 26.81 kg/m  General: Well developed, well nourished, seated, in no evident distress, black hair, brown eyes, right handed Head: Head normocephalic and atraumatic.  Oropharynx benign. Neck: Supple with no carotid bruits Cardiovascular: Regular rate and rhythm, no murmurs Respiratory: Breath sounds clear to auscultation Musculoskeletal: No obvious deformities or scoliosis Skin: No rashes or neurocutaneous lesions  Neurologic Exam Mental Status: Awake and fully alert.  Oriented to place and time.  Recent and remote memory intact.  Attention span, concentration, and fund of knowledge appropriate.  Mood and affect appropriate. Cranial Nerves: Fundoscopic exam reveals sharp disc margins.  Pupils equal, briskly reactive to light.  Extraocular movements full without nystagmus.  Visual fields full to confrontation.  Hearing intact and symmetric to finger rub.  Facial sensation intact.  Face tongue, palate move normally and symmetrically.  Neck flexion and extension normal. Motor: Normal bulk and tone. Normal strength in all tested extremity muscles. Sensory: Intact to touch and temperature in all extremities.  Coordination: Rapid alternating movements normal in all extremities.   Finger-to-nose and heel-to shin performed accurately bilaterally.  Romberg negative. Gait and Station: Arises from chair without difficulty.  Stance is normal. Gait demonstrates normal stride length and balance.   Able to heel, toe and tandem walk without difficulty. Reflexes: Diminished and symmetric. Toes downgoing.  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 Gerry has neither had nor required imaging or lab studies since the last visit. 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 He is taking and tolerating Lamotrigine for his seizure disorder and has remained seizure free since May 2013. He will continue his medication without change and will return for follow up in 1 year or sooner if needed. Napoleon and his mother agreed with the plans made today.   The medication list was reviewed and reconciled.  No changes were made in the prescribed medications today.  A complete medication list was provided to the patient's mother.  Allergies as of 04/02/2017   No Known Allergies     Medication List       Accurate as of 04/02/17  8:40 PM. Always use your most recent med list.          lamoTRIgine 25  MG tablet Commonly known as:  LAMICTAL TAKE 1 TABLET BY MOUTH EVERY MORNING, 2 TABLETS AT MIDDAY AND 3 TABLETS EVERY NIGHT AT BEDTIME            Discharge Care Instructions        Start     Ordered   04/02/17 0000  lamoTRIgine (LAMICTAL) 25 MG tablet    Question:  Supervising Provider  Answer:  Deetta Perla   04/02/17 1151      Dr. Sharene Skeans was consulted regarding the patient.   Total time spent with the patient was 20 minutes, of which 50% or more was spent in counseling and coordination of care.   Elveria Rising NP-C

## 2017-04-28 ENCOUNTER — Other Ambulatory Visit (INDEPENDENT_AMBULATORY_CARE_PROVIDER_SITE_OTHER): Payer: Self-pay | Admitting: Pediatrics

## 2017-04-28 DIAGNOSIS — G40309 Generalized idiopathic epilepsy and epileptic syndromes, not intractable, without status epilepticus: Secondary | ICD-10-CM

## 2017-09-08 IMAGING — CR DG SHOULDER 1V*L*
2 series · 2 of 2 positions shown · non-contrast
Comparison: Portable exam 3183 hours compared to earlier study of
09/25/2015

CLINICAL DATA: LEFT shoulder dislocation post reduction

EXAM:
LEFT SHOULDER - 1 VIEW

[ap (1 of 2)]
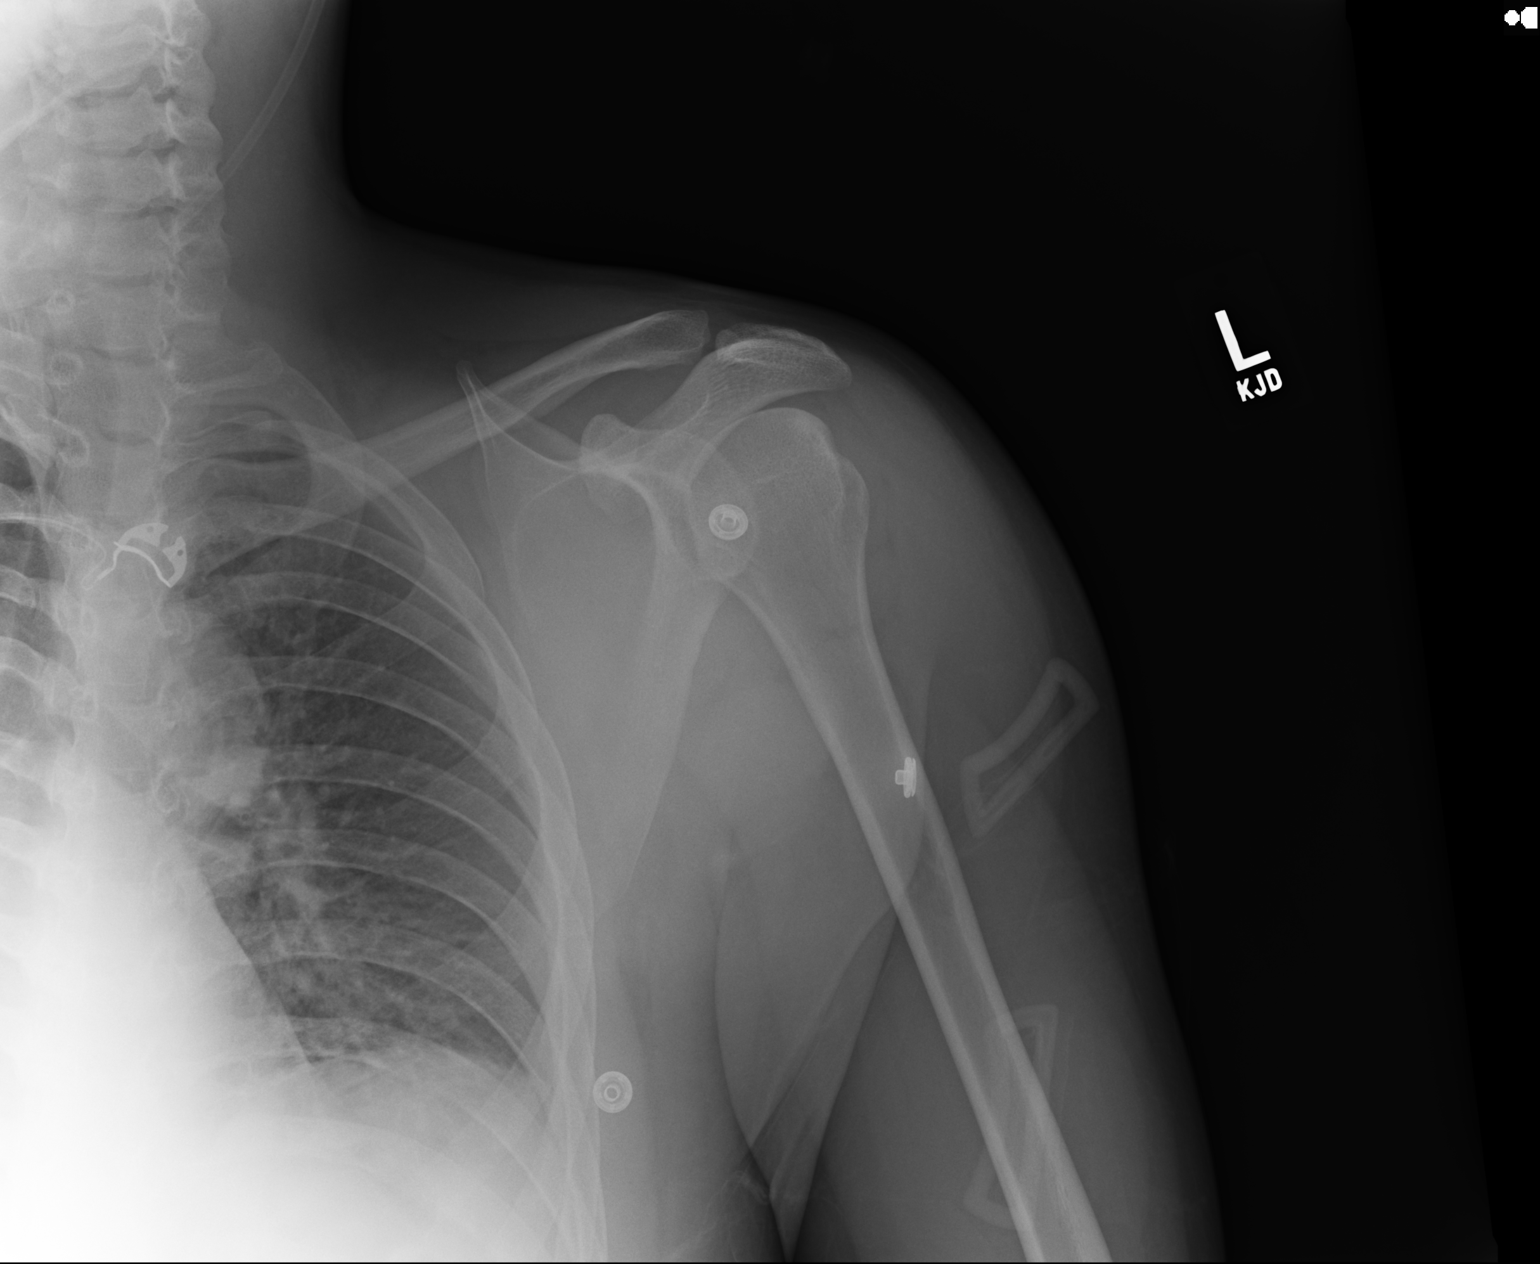

[ap (2 of 2)]
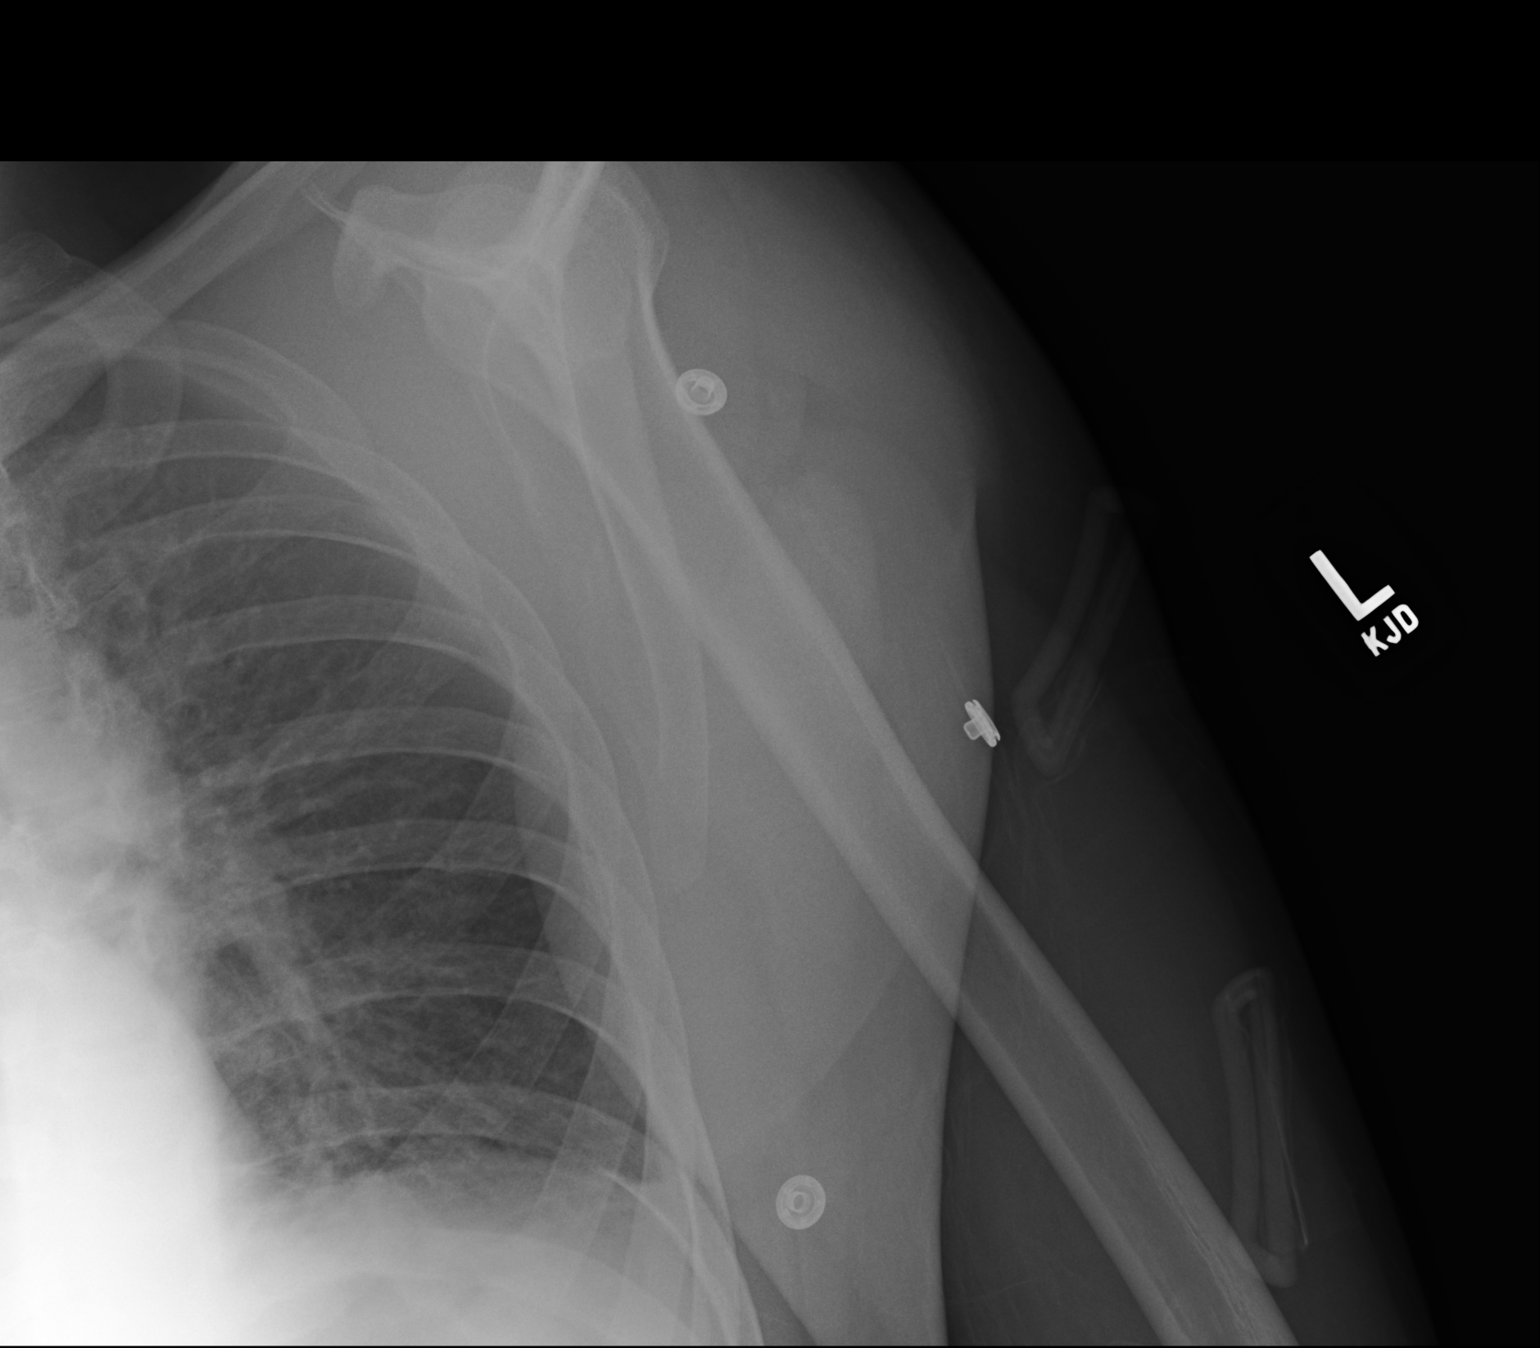

[2 of 2 positions shown; findings below may reference images not displayed]

FINDINGS: Glenohumeral alignment now appears normal.

AC joint alignment normal.

Osseous mineralization normal.

No acute fracture, dislocation or bone destruction.
IMPRESSION: Reduction of previously identified LEFT glenohumeral dislocation.

## 2017-09-08 IMAGING — DX DG SHOULDER 2+V*L*
2 series · 2 of 2 positions shown · non-contrast
Comparison: None.

CLINICAL DATA: Left shoulder pain with decreased range of motion
since last night.

EXAM:
LEFT SHOULDER - 2+ VIEW

[w shoulder internal left]
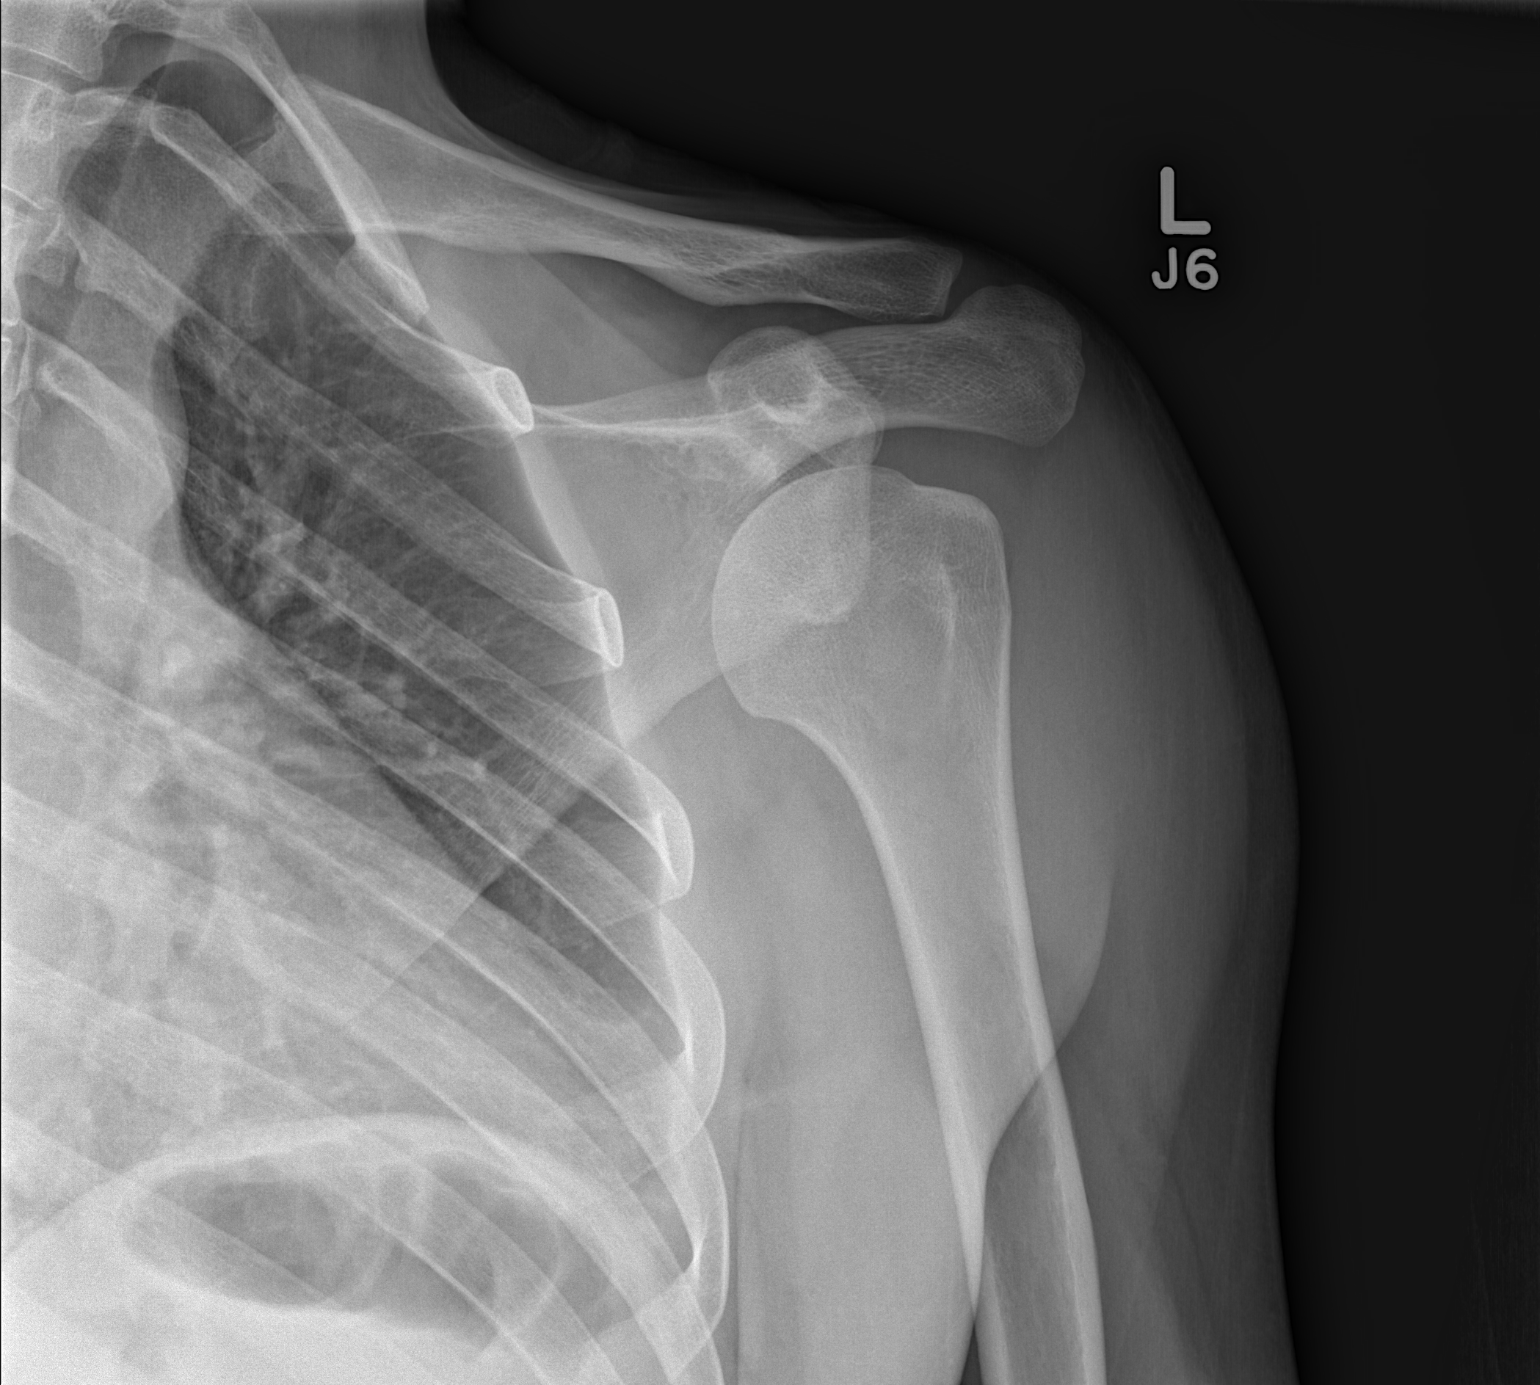

[w shoulder external left]
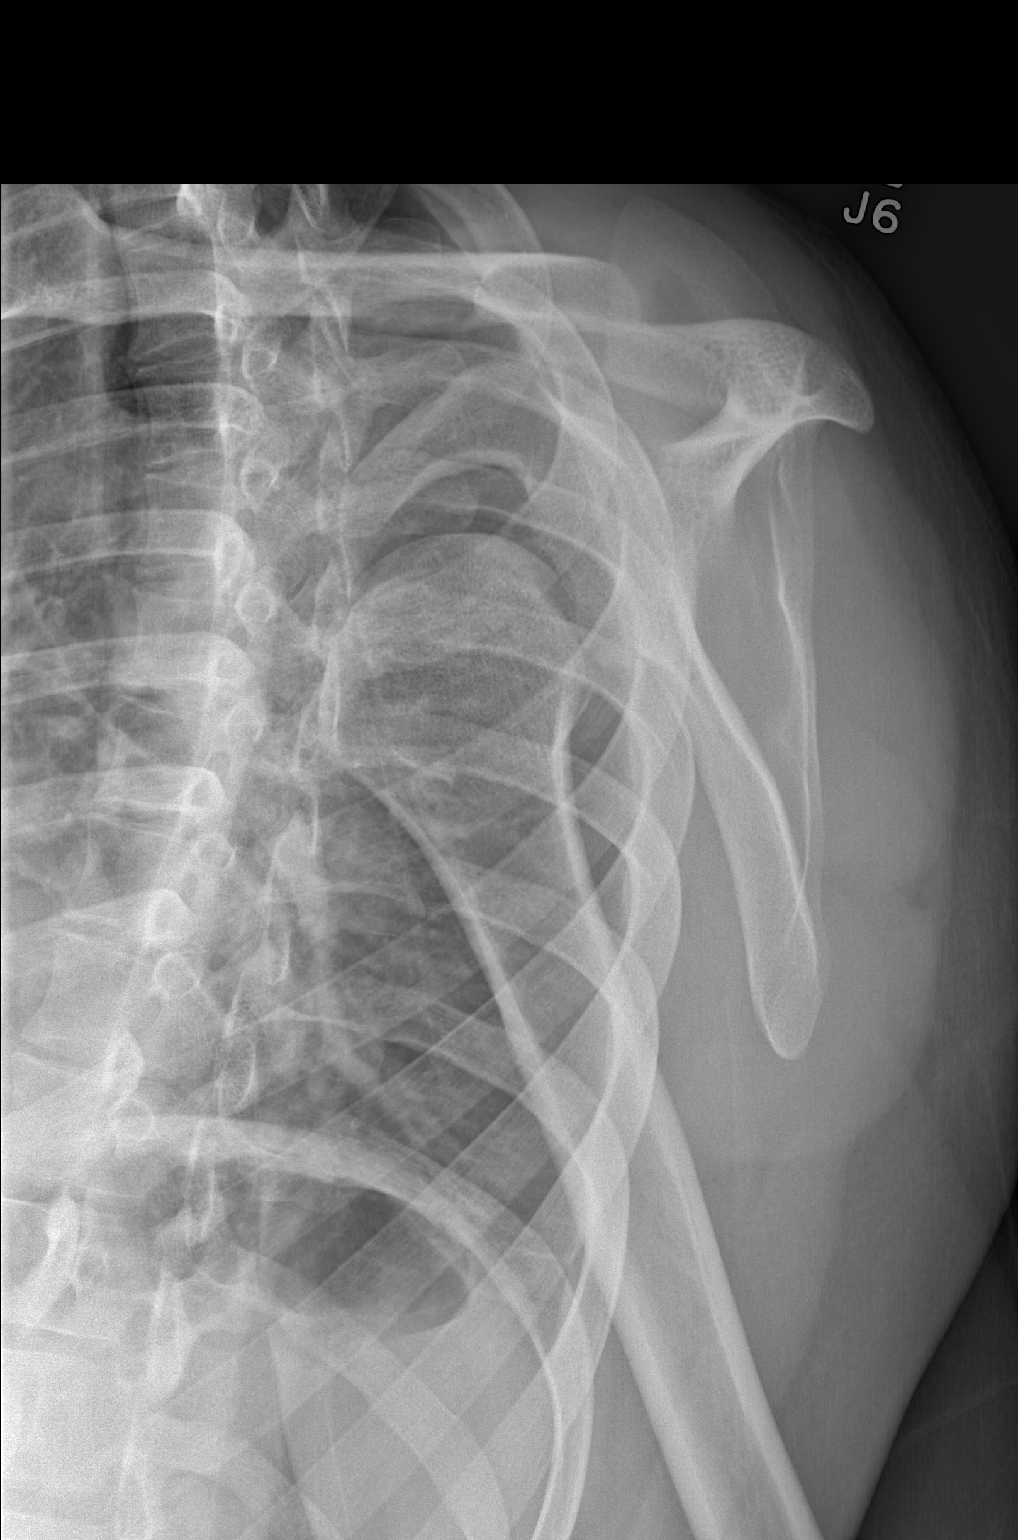

[2 of 2 positions shown; findings below may reference images not displayed]

FINDINGS: There is anterior dislocation of the left humeral head with a
Hill-Sachs lesion. No visible fracture of the glenoid. AC joint is
normal.
IMPRESSION: Anterior dislocation of the left humeral head.

## 2017-09-23 ENCOUNTER — Other Ambulatory Visit (INDEPENDENT_AMBULATORY_CARE_PROVIDER_SITE_OTHER): Payer: Self-pay | Admitting: Family

## 2017-09-23 DIAGNOSIS — G40309 Generalized idiopathic epilepsy and epileptic syndromes, not intractable, without status epilepticus: Secondary | ICD-10-CM

## 2018-10-26 ENCOUNTER — Other Ambulatory Visit (INDEPENDENT_AMBULATORY_CARE_PROVIDER_SITE_OTHER): Payer: Self-pay | Admitting: Family

## 2018-10-26 DIAGNOSIS — G40309 Generalized idiopathic epilepsy and epileptic syndromes, not intractable, without status epilepticus: Secondary | ICD-10-CM

## 2018-11-18 ENCOUNTER — Encounter (INDEPENDENT_AMBULATORY_CARE_PROVIDER_SITE_OTHER): Payer: Self-pay | Admitting: Family

## 2018-11-18 ENCOUNTER — Ambulatory Visit (INDEPENDENT_AMBULATORY_CARE_PROVIDER_SITE_OTHER): Payer: Medicaid Other | Admitting: Family

## 2018-11-18 ENCOUNTER — Other Ambulatory Visit: Payer: Self-pay

## 2018-11-18 VITALS — BP 136/84 | HR 104 | Ht 68.5 in | Wt 188.6 lb

## 2018-11-18 DIAGNOSIS — G40309 Generalized idiopathic epilepsy and epileptic syndromes, not intractable, without status epilepticus: Secondary | ICD-10-CM | POA: Diagnosis not present

## 2018-11-18 DIAGNOSIS — F70 Mild intellectual disabilities: Secondary | ICD-10-CM | POA: Diagnosis not present

## 2018-11-18 NOTE — Progress Notes (Signed)
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 He is taking and tolerating Lamotrigine for his seizure disorder, and has occasional brief breakthrough seizures with missed doses of medication. He is at home with his mother and needs reminders to take medication and for some activities of daily living. He is not employed. There are no problems with behavior.   Today's concerns: Mom reports today that Benjamin Wagner is generally compliant with medication but occasionally misses doses and tends to have a brief seizure when that occurs. He has not been injured with any of the seizures. Mom says that he has been bored due to staying at home with the Covid 19 pandemic but that there have been no problems with behavior. He helps with some chores around the house, watches TV and plays some video games. Mom says that Benjamin Wagner has been otherwise generally healthy since his last visit, and neither she nor Benjamin Wagner have other health concerns for him today.   Review of systems: Please see HPI for neurologic and other pertinent review of systems. Otherwise all other systems were reviewed and were negative.  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 This occurred at rest, with hyperventilation and has further myoclonic responses. He had a seizure in April 2007 with left focal motor activity with secondary generalization. On February 24, 2008, the patient had repeat EEG, which again showed frequent burst of generalized four hertz spike and slow wave activity in single bursts and in clusters lasting up to two seconds in duration. The episodes did not occur during photic stimulation and were exacerbated by hyperventilation. His background was otherwise normal. He had a repeat EEG on October 08, 2012, that showed three episodes of generalized regularly contoured spike and slow wave activity followed by two episodes of four hertz regularly contoured spike and slow wave activity that was frontally prominent, but generalized. The episodes were lasted for a second or less. Background activity was otherwise normal.  The patient was initially placed on Lamictal because of his abnormal EEG and the potential for having further seizures. In 2009, he was on 25 mg twice daily. He had a normal examination. Plans were made to perform an EEG at [facility] and to consider tapering and discontinuing the medications. He was lost to follow up and when seen again was taking Lamotrigine at a dose of 25 mg in the morning and 50 mg in the afternoon and 75 mg at nighttime.  Other medical problems for this young man included acne, hypercholesterolemia, and intellectual disability. For reasons that are unclear, he had been placed on metformin, though he was not obese. He had normal blood sugar and hemoglobin A1c of 5.8.  In high school, he was in  the self-maintained life skills class and participated in [employer]. He has an older brother with cognitive impairment and autism. His IQ has been measured as full scale IQ 41 and verbal 45.   Surgical history: Past Surgical History:  Procedure Laterality Date   CIRCUMCISION  1996     Family  history: family history is not on file.   Social history: Social History   Socioeconomic History   Marital status: Single    Spouse name: Not on file   Number of children: Not on file   Years of education: Not on file   Highest education level: Not on file  Occupational History   Not on file  Social Needs   Financial resource strain: Not on file   Food insecurity:    Worry: Not on file    Inability: Not on file   Transportation needs:    Medical: Not on file    Non-medical: Not on file  Tobacco Use   Smoking status: Passive Smoke Exposure - Never Smoker   Smokeless tobacco: Never Used   Tobacco comment: Mom smokes   Substance and Sexual Activity   Alcohol use: No    Alcohol/week: 0.0 standard drinks   Drug use: No   Sexual activity: Never  Lifestyle   Physical activity:    Days per week: Not on file    Minutes per session: Not on file   Stress: Not on file  Relationships   Social connections:    Talks on phone: Not on file    Gets together: Not on file    Attends religious service: Not on file    Active member of club or organization: Not on file    Attends meetings of clubs or organizations: Not on file    Relationship status: Not on file   Intimate partner violence:    Fear of current or ex partner: Not on file    Emotionally abused: Not on file    Physically abused: Not on file    Forced sexual activity: Not on file  Other Topics Concern   Not on file  Social History Narrative   Benjamin Wagner graduated from [employer] in 2015. He enjoys playing computer games.   Lives with his mother.    Wants to take some classes at [employer]     Allergies: No Known Allergies   Immunizations:  There is no immunization history on file for this patient.    Diagnostics/Screenings: rEEG 10/09/2012 - Abnormal EEG on the basis of epileptiform activity that is interictal and epileptogenic from electrographic viewpoint correlating with a primary  generalized seizure disorder.  The findings suggest the patient is still at risk for recurrent seizures.  Deanna Artis. Sharene Skeans, M.D.   Physical Exam: BP 136/84    Pulse (!) 104    Ht 5' 8.5" (1.74 m)    Wt 188 lb 9.6 oz (85.5 kg)    BMI 28.26 kg/m   General: Well developed, well nourished young man, seated on exam table, in no evident distress, black hair, brown eyes, right handed Head: Head normocephalic and atraumatic.  Oropharynx benign. Neck: Supple with no carotid bruits Cardiovascular: Regular rate and rhythm, no murmurs Respiratory: Breath sounds clear to auscultation Musculoskeletal: No obvious deformities or scoliosis Skin: No rashes or neurocutaneous lesions  Neurologic Exam Mental Status: Awake and fully alert.  Oriented to place and time.  Attention span and concentration appropriate.  Fund of knowledge subnormal for age. Mood and affect appropriate. Was able to  follow simple commands but needed coaching and redirection at times.  Cranial Nerves: Fundoscopic exam reveals sharp disc margins.  Pupils equal, briskly reactive to light.  Extraocular movements full without nystagmus.  Visual fields appear full but he had some difficulty following directions for this portion of the examination. Hearing intact and symmetric to whisper.  Facial sensation intact.  Face tongue, palate move normally and symmetrically.  Neck flexion and extension normal. Motor: Normal bulk and tone. Normal strength in all tested extremity muscles. Sensory: Intact to touch and temperature in all extremities.  Coordination: Rapid alternating movements normal in all extremities.  Finger-to-nose and heel-to shin performed accurately bilaterally.  Romberg negative. Gait and Station: Arises from chair without difficulty.  Stance is normal. Gait demonstrates normal stride length and balance.   Able to heel, toe and tandem walk without difficulty. Reflexes: 1+ and symmetric. Toes downgoing.   Impression: 1.   Generalized convulsive epilepsy 2.  Mild intellectual disability   Recommendations for plan of care: [facility] were [facility]. Benjamin Wagner has neither had nor required imaging or lab studies since the last visit. He is a 23 year old young man with history of generalized convulsive epilepsy and mild intellectual disability. He is taking and tolerating Lamotrigine and has has occasional breakthrough seizures when doses are missed. I talked with Benjamin Wagner and his mother about ways remember doses, such as an alarm in the phone or using a daily checklists. I encouraged him to work at being more complaint with medication and asked Mom to let me know if seizures continue to occur. I will otherwise see Benjamin Wagner back in follow up in 1 year or sooner if needed. Mom agreed with the plans made today.   The medication list was reviewed and reconciled. No changes were made in the prescribed medications today. A complete medication list was provided to the patient.  Allergies as of 11/18/2018   No Known Allergies     Medication List       Accurate as of Nov 18, 2018  4:43 PM. If you have any questions, ask your nurse or doctor.        lamoTRIgine 25 MG tablet Commonly known as:  LAMICTAL TAKE 1 TABLET BY MOUTH EVERY MORNING, 2 TABLETS MIDDAY AND 3 TABLETS EVERY NIGHT AT BEDTIME        Total time spent with the patient was 20 minutes, of which 50% or more was spent in counseling and coordination of care.   Elveria Risingina Elisha Cooksey NP-C [facility] Ph. [phone] Fax [phone]

## 2018-11-18 NOTE — Patient Instructions (Signed)
Thank you for coming in today.   Instructions for you until your next appointment are as follows: 1. Continue taking Lamotrigine 25mg  - 1 tablet in the morning, 2 tablets at midday and 3 tablets at bedtime.  2. Work on trying to not miss any of your doses by setting an alarm in your phone, or using a checklist to remind you each day. 3. Let me know if you have more seizures 4. Please sign up for MyChart if you have not done so 5. Please plan to return for follow up in one year or sooner if needed.

## 2019-02-01 ENCOUNTER — Telehealth (INDEPENDENT_AMBULATORY_CARE_PROVIDER_SITE_OTHER): Payer: Self-pay | Admitting: Family

## 2019-02-01 DIAGNOSIS — G40309 Generalized idiopathic epilepsy and epileptic syndromes, not intractable, without status epilepticus: Secondary | ICD-10-CM

## 2019-02-01 MED ORDER — LAMOTRIGINE 25 MG PO TABS: ORAL_TABLET | ORAL | 0 refills | Status: DC

## 2019-02-01 NOTE — Telephone Encounter (Signed)
Rx has been sent to the pharmacy

## 2019-02-01 NOTE — Telephone Encounter (Signed)
°  Who's calling (name and relationship to patient) : Deon Pilling contact number: [phone]  Provider they see: Cloretta Ned  Reason for call: Need refill of medication, patient is currently out.     PRESCRIPTION REFILL ONLY  Name of prescription: Lamtrigine(Lamictal) 25 mg  Pharmacy:[facility]

## 2019-04-21 ENCOUNTER — Telehealth (INDEPENDENT_AMBULATORY_CARE_PROVIDER_SITE_OTHER): Payer: Self-pay | Admitting: Family

## 2019-04-21 DIAGNOSIS — G40309 Generalized idiopathic epilepsy and epileptic syndromes, not intractable, without status epilepticus: Secondary | ICD-10-CM

## 2019-04-21 MED ORDER — LAMOTRIGINE 25 MG PO TABS: ORAL_TABLET | ORAL | 1 refills | Status: DC

## 2019-04-21 NOTE — Telephone Encounter (Signed)
Rx has been sent to the pharmacy

## 2019-04-23 ENCOUNTER — Telehealth (INDEPENDENT_AMBULATORY_CARE_PROVIDER_SITE_OTHER): Payer: Self-pay | Admitting: Family

## 2019-04-23 DIAGNOSIS — G40309 Generalized idiopathic epilepsy and epileptic syndromes, not intractable, without status epilepticus: Secondary | ICD-10-CM

## 2019-04-23 MED ORDER — LAMOTRIGINE 25 MG PO TABS: ORAL_TABLET | ORAL | 1 refills | Status: DC

## 2019-04-23 NOTE — Telephone Encounter (Signed)
Spoke with patient to inform him that his medication has been sent to the pharmacy

## 2019-04-23 NOTE — Telephone Encounter (Signed)
Who's calling (name and relationship to patient) : Benjamin Wagner (mom)  Best contact number: [phone]  Provider they see: Rockwell Germany  Reason for call:  Mom called in stating that Benjamin Wagner is out of Lamictal and is needing that refilled  Call ID:      PRESCRIPTION REFILL ONLY  Name of prescription: Lamictal   Pharmacy: [facility]

## 2019-04-23 NOTE — Telephone Encounter (Signed)
Please let Mom know that the refill was called in. Thanks, Otila Kluver

## 2019-10-12 ENCOUNTER — Other Ambulatory Visit (INDEPENDENT_AMBULATORY_CARE_PROVIDER_SITE_OTHER): Payer: Self-pay | Admitting: Family

## 2019-10-12 DIAGNOSIS — G40309 Generalized idiopathic epilepsy and epileptic syndromes, not intractable, without status epilepticus: Secondary | ICD-10-CM

## 2019-10-12 MED ORDER — LAMOTRIGINE 25 MG PO TABS: ORAL_TABLET | ORAL | 0 refills | Status: DC

## 2019-10-13 ENCOUNTER — Telehealth (INDEPENDENT_AMBULATORY_CARE_PROVIDER_SITE_OTHER): Payer: Self-pay | Admitting: Family

## 2019-10-13 NOTE — Telephone Encounter (Signed)
Please let Mom know that I sent in this refill yesterday and to check with the pharmacy about when it can be refilled. Thanks, Inetta Fermo

## 2019-10-13 NOTE — Telephone Encounter (Signed)
Spoke with mom about the refill request. Informed her that the refills were sent in yesterday and to call the pharmacy to get the medication. She understood

## 2019-10-13 NOTE — Telephone Encounter (Signed)
  Who's calling (name and relationship to patient) : Shough,Jacqueline Best contact number: [phone] Provider they see: [place]  Reason for call: [facility] is almost out of this medication.      PRESCRIPTION REFILL ONLY  Name of prescription: Lamictal 25mg  Pharmacy: [facility], [address]

## 2019-11-24 ENCOUNTER — Other Ambulatory Visit: Payer: Self-pay

## 2019-11-24 ENCOUNTER — Encounter (INDEPENDENT_AMBULATORY_CARE_PROVIDER_SITE_OTHER): Payer: Self-pay | Admitting: Family

## 2019-11-24 ENCOUNTER — Ambulatory Visit (INDEPENDENT_AMBULATORY_CARE_PROVIDER_SITE_OTHER): Payer: Medicaid Other | Admitting: Family

## 2019-11-24 VITALS — BP 130/100 | HR 96 | Ht 68.5 in | Wt 165.2 lb

## 2019-11-24 DIAGNOSIS — G40309 Generalized idiopathic epilepsy and epileptic syndromes, not intractable, without status epilepticus: Secondary | ICD-10-CM

## 2019-11-24 DIAGNOSIS — F70 Mild intellectual disabilities: Secondary | ICD-10-CM

## 2019-11-24 NOTE — Progress Notes (Signed)
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 He is taking and tolerating Lamotrigine for his seizure disorder, and has occasional brief breakthrough seizures with missed doses of medication. He is at home with his mother and needs reminders to take medication and for some activities of daily living. He is not employed. There are no problems with behavior.   Today's concerns:  Mom reports today that Benjamin Wagner has had one seizure since his last visit, likely due to a missed dose. He lives at home with his mother and needs reminders to take medication and perform activities of daily living. He is able to do some simple chores around the house but has not been able to be employed.   Benjamin Wagner has been otherwise generally healthy since he was last seen. Mom has no other health concerns for him today other than previously mentioned.   Review of systems: Please see HPI for neurologic and other pertinent review of systems. Otherwise all other systems were reviewed and were negative.  Problem List: Patient Active Problem List   Diagnosis Date Noted  . 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 Seizures (HCC)     Past medical history comments: See HPI Copied from previous record: EEG on August 02, 2005, showed frequent three to four hertz regularly contoured spike in slow wave of activity that happened in one and a half to two-second intervals multiple times and also in single burst. This occurred at rest, with hyperventilation and has further myoclonic  responses. He had a seizure in April 2007 with left focal motor activity with secondary generalization. On February 24, 2008, the patient had repeat EEG, which again showed frequent burst of generalized four hertz spike and slow wave activity in single bursts and in clusters lasting up to two seconds in duration. The episodes did not occur during photic stimulation and were exacerbated by hyperventilation. His background was otherwise normal. He had a repeat EEG on October 08, 2012, that showed three episodes of generalized regularly contoured spike and slow wave activity followed by two episodes of four hertz regularly contoured spike and slow wave activity that was frontally prominent, but generalized. The episodes were lasted for a second or less. Background activity was otherwise normal.  The patient was initially placed on Lamictal because of his abnormal EEG and the potential for having further seizures. In 2009, he was on 25 mg twice daily. He had a normal examination. Plans were made to perform an EEG at [facility] and to consider tapering and discontinuing the medications. He was lost to follow up and when seen again was taking Lamotrigine at a dose of 25 mg in the morning and 50 mg in the afternoon and 75 mg at nighttime.  Other medical problems for this young man included acne, hypercholesterolemia, and intellectual disability. For reasons that are unclear, he had been placed on metformin, though he was not obese. He had normal blood sugar and hemoglobin A1c of 5.8.  In high school, he was in the self-maintained life skills class and participated in [employer]. He has an older brother with  cognitive impairment and autism. His IQ has been measured as full scale IQ 41 and verbal 45.   Surgical history: Past Surgical History:  Procedure Laterality Date  . CIRCUMCISION  1996     Family history: family history is not on file.   Social history: Social History   Socioeconomic History   . Marital status: Single    Spouse name: Not on file  . Number of children: Not on file  . Years of education: Not on file  . Highest education level: Not on file  Occupational History  . Not on file  Tobacco Use  . Smoking status: Passive Smoke Exposure - Never Smoker  . Smokeless tobacco: Never Used  . Tobacco comment: Mom smokes   Substance and Sexual Activity  . Alcohol use: No    Alcohol/week: 0.0 standard drinks  . Drug use: No  . Sexual activity: Never  Other Topics Concern  . Not on file  Social History Narrative   Benjamin Wagner graduated from [employer] in 2015. He enjoys playing computer games.   Lives with his mother.    Wants to take some classes at [employer]   Social Determinants of Health   Financial Resource Strain:   . Difficulty of Paying Living Expenses:   Food Insecurity:   . Worried About Programme researcher, broadcasting/film/video in the Last Year:   . Barista in the Last Year:   Transportation Needs:   . Freight forwarder (Medical):   Marland Kitchen Lack of Transportation (Non-Medical):   Physical Activity:   . Days of Exercise per Week:   . Minutes of Exercise per Session:   Stress:   . Feeling of Stress :   Social Connections:   . Frequency of Communication with Friends and Family:   . Frequency of Social Gatherings with Friends and Family:   . Attends Religious Services:   . Active Member of Clubs or Organizations:   . Attends Banker Meetings:   Marland Kitchen Marital Status:   Intimate Partner Violence:   . Fear of Current or Ex-Partner:   . Emotionally Abused:   Marland Kitchen Physically Abused:   . Sexually Abused:      Past/failed meds:   Allergies: No Known Allergies    Immunizations:  There is no immunization history on file for this patient.    Diagnostics/Screenings: rEEG 10/09/2012 - Abnormal EEG on the basis of epileptiform activity that is interictal and epileptogenic from electrographic viewpoint correlating with a primary generalized seizure  disorder. The findings suggest the patient is still at risk for recurrent seizures.  Deanna Artis. Sharene Skeans, M.D.   Physical Exam: BP (!) 130/100   Pulse 96   Ht 5' 8.5" (1.74 m)   Wt 165 lb 3.2 oz (74.9 kg)   BMI 24.75 kg/m   General: Well developed, well nourished young man, seated in exam room, in no evident distress, black hair, brown eyes, right handed Head: Head normocephalic and atraumatic.  Oropharynx benign. Neck: Supple with no carotid bruits Cardiovascular: Regular rate and rhythm, no murmurs Respiratory: Breath sounds clear to auscultation Musculoskeletal: No obvious deformities or scoliosis Skin: No rashes or neurocutaneous lesions  Neurologic Exam Mental Status: Awake and fully alert.  Oriented to place and time. Attention span and concentration appropriate. Fund of knowledge subnormal for age. He is unable to answer most questions and looks to his mother for help.  Mood and affect appropriate. Cranial Nerves: Fundoscopic exam reveals sharp disc margins.  Pupils equal,  briskly reactive to light.  Extraocular movements full without nystagmus.  Visual fields full to confrontation.  Hearing intact and symmetric to finger rub.  Facial sensation intact.  Face tongue, palate move normally and symmetrically.  Neck flexion and extension normal. Motor: Normal bulk and tone. Normal strength in all tested extremity muscles. Sensory: Intact to touch and temperature in all extremities.  Coordination: Rapid alternating movements normal in all extremities.  Finger-to-nose and heel-to shin performed accurately bilaterally.  Romberg negative. Gait and Station: Arises from chair without difficulty.  Stance is normal. Gait demonstrates normal stride length and balance.   Able to heel, toe and tandem walk without difficulty. Reflexes: 1+ and symmetric. Toes downgoing.  Impression: 1. Generalized convulsive epilepsy 2. Mild intellectual disability  Recommendations for plan of care: The  patient's previous [facility] records were reviewed. Avondre has neither had nor required imaging or lab studies since the last visit. He is a 24 year old young man with history of generalized convulsive epilepsy and intellectual disability. He is taking and tolerating Lamotrigine for his seizure disorder and tends to have breakthrough seizures when he misses doses of medication. I reminded Rishit and his mother of the need for him to be compliant with medication and to get at least 8 hours of sleep each night. I will see Jacaden back in follow up in 1 year or sooner if needed. Mom agreed with the plans made today.   The medication list was reviewed and reconciled. No changes were made in the prescribed medications today. A complete medication list was provided to the patient.  Allergies as of 11/24/2019   No Known Allergies     Medication List       Accurate as of Nov 24, 2019  4:03 PM. If you have any questions, ask your nurse or doctor.        lamoTRIgine 25 MG tablet Commonly known as: LAMICTAL TAKE 1 TABLET BY MOUTH EVERY MORNING, 2 TABLETS MIDDAY AND 3 TABLETS EVERY NIGHT AT BEDTIME      Total time spent with the patient was 20 minutes, of which 50% or more was spent in counseling and coordination of care.  Rockwell Germany NP-C [facility] Ph. [phone] Fax [phone]

## 2019-11-27 ENCOUNTER — Encounter (INDEPENDENT_AMBULATORY_CARE_PROVIDER_SITE_OTHER): Payer: Self-pay | Admitting: Family

## 2019-11-27 MED ORDER — LAMOTRIGINE 25 MG PO TABS
ORAL_TABLET | ORAL | 3 refills | Status: DC
Start: 2019-11-27 — End: 2021-01-05

## 2019-11-27 NOTE — Patient Instructions (Signed)
Thank you for coming in today.   Instructions for you until your next appointment are as follows: 1. Continue to take Lamotrigine every day as prescribed. Try not to miss any doses 2. Remember that it is important for you to get at least 8 hours of sleep each night as sleep deprivation is known to trigger seizures 3. Please sign up for MyChart if you have not done so 4. Please plan to return for follow up in one year or sooner if needed.

## 2020-10-17 NOTE — H&P (Signed)
[facility] OFFICE  [address] [place], [place] [address]     [name]    Date of Visit:  11/17/2014  Date of Birth: Nov 19, 1995  Age: 25 yrs.   Medical Record Number: [id number]  Referring Physician: ABEDIN MD, Laurena Bering  __   CURRENT DIAGNOSES     1. Chest pain, unspecified, R07.9  __  ALLERGIES     No Known Drug Allergies  __  MEDICATIONS     __   CHIEF COMPLAINT/REASON FOR VISIT  Chest pain, unspecified  __  HISTORY OF PRESENT ILLNESS  Barry Gross  is a pleasant 25 year old young man who presents for evaluation of chest pain with a consult ordered by Dr. Shaune Pollack. Barry Gross has no personal history of cardiac disease. He had a murmur diagnosed about a year ago and had an echocardiogram done that was  normal. There are no family members with early coronary disease. 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 He does not have any symptoms when he is actually playing basketball  and there are days that he is able to run without necessarily having the pain. It is a mild discomfort located on the right side of his chest that goes away when he rests. It sometimes happens on the left side. He also has noticed this with sneezing.  It is not associated with any other symptoms and resolves on its own. The patient has not done any weightlifting or stretching to pull the muscles in that region.   __  PAST HISTORY      Past Medical Illnesses: Hyperlipidemia, scoliosis;  Past Cardiac Illnesses : No previous history of cardiac disease.; Infectious Diseases: No previous history of significant infectious diseases.;  Cardiology Procedures-Noninvasive: Echocardiogram -Normal January 2015; Left Ventricular Ejection Fraction : LVEF of 72% documented via echocardiogram on 08/03/2013  ___  FAMILY HISTORY  Father --  Cancer  Mother -- Heart murmur    __  CARDIAC RISK FACTORS     Tobacco Abuse : has never used tobacco; Family History of Heart  Disease: positive; Hyperlipidemia : positive; Hypertension: negative;  Diabetes Mellitus : negative; Prior History of Heart Disease: negative; Obesity : negative; Sedentary Life Style:negative; ZHY:[id number]   __  SOCIAL HISTORY    Alcohol Use : Does not use alcohol; Smoking: Does not smoke; Never smoker ([id number]); Diet : Regular diet; Exercise: Exercises regularly;   __  REVIEW OF SYSTEMS     General: Denies recent weight loss, weight gain, fever or chills or change in exercise tolerance.; Integumentary : Denies any change in hair or nails, rashes, or skin lesions.; Eyes: wears eye glasses/contact lenses;  Ears, Nose, Throat, Mouth: Denies any hearing loss, epistaxis, hoarseness or difficulty speaking.;Respiratory : Denies dyspnea, cough, wheezing or hemoptysis.; Cardiovascular: Please review HPI; Abdominal  : Denies ulcer disease, hematochezia or melena.;Musculoskeletal:Denies any venous insufficiency, arthritic symptoms or back problems.;  Neurological : Denies any recurrent strokes, TIA, or seizure disorder.; Psychiatric: Denies any depression,  substance abuse or change in cognitive functions.; Endocrine: Denies any weight change, heat/cold intolerance, polydipsia, or polyuria;  Hematologic/Immunologic: Denies any food allergies, seasonal allergies, bleeding disorders.  __  PHYSICAL EXAMINATION     Vital Signs:  Blood Pressure:  120/80 Sitting, Left arm, regular cuff  120/80 Sitting, Right arm, regular cuff     Weight: 163.00 lbs.  Height: 74"  BMI:  21   Pulse: 74/min. Apical  Constitutional:  Cooperative, alert and oriented,well developed, well nourished, in no acute distress. Skin: Warm and dry to touch, no apparent skin lesions, or masses  noted. Head: Normocephalic, normal hair pattern, no masses or tenderness Eyes : EOMS Intact, PERRL, conjunctivae and lids normal. Funduscopic exam and visual fields not performed. ENT: Ears, Nose and throat reveal no gross abnormalities.  No pallor or cyanosis.  Dentition good. Neck: No palpable masses or adenopathy, no thyromegaly, no JVD, carotid pulses are full and equal bilaterally  without bruits. Chest: Normal symmetry, no tenderness to palpation, normal respiratory excursion, no intercostal retraction, no use of accessory muscles,  normal diaphragmatic excursion, clear to auscultation and percussion. Cardiac: Regular rhythm, S1 normal, S2 normal, No S3 or S4, Apical impulse not  displaced, no murmurs, gallops or rubs detected. Abdomen: Abdomen soft, bowel sounds normoactive, no masses, no hepatosplenomegaly, non-tender, no bruits  Peripheral Pulses: The femoral, popliteal, dorsalis pedis, and posterior tibial pulses are full and equal bilaterally with no bruits auscultated. Extremities/Back : No deformities, clubbing, cyanosis, erythema or edema observed. There are no spinal abnormalities noted. Normal muscle strength and tone. Neurological : No gross motor or sensory deficits noted, affect appropriate, oriented to time, person and place.   __    Medications added today by the physician:     ECG:  His ECG today is normal for his age with an incomplete right bundle branch block pattern and early repolarization.     IMPRESSIONS:  1. Chest pain.   2. Normal echocardiogram in 2015.     RECOMMENDATIONS:  Kinney  has a low pretest probability for the chest pain being from coronary artery disease. I suspect it to be more likely from musculoskeletal cause. We will schedule a treadmill stress test and if he does well, I do not think further testing will be necessary.      Pricila Bridge A. Marnette Burgess, MD, [facility]    TAZ/tucbh     cc: Georgena Spurling MD    am  ____________________________  Christianne Dolin  Exercise Treadmill [facility] Available  12 Lead ECG  Today

## 2020-11-19 ENCOUNTER — Encounter (INDEPENDENT_AMBULATORY_CARE_PROVIDER_SITE_OTHER): Payer: Self-pay

## 2021-01-05 ENCOUNTER — Telehealth (INDEPENDENT_AMBULATORY_CARE_PROVIDER_SITE_OTHER): Payer: Self-pay | Admitting: Family

## 2021-01-05 DIAGNOSIS — G40309 Generalized idiopathic epilepsy and epileptic syndromes, not intractable, without status epilepticus: Secondary | ICD-10-CM

## 2021-01-05 MED ORDER — LAMOTRIGINE 25 MG PO TABS: ORAL_TABLET | ORAL | 0 refills | Status: DC

## 2021-01-05 NOTE — Telephone Encounter (Signed)
Called mom to get patient scheduled for Inetta Fermo and he only has 4 pills left and mom needing to pick up his medication today. He is scheduled to come in on July 12th @ 11:00 am

## 2021-01-05 NOTE — Telephone Encounter (Signed)
Medication sent by Inetta Fermo

## 2021-01-23 ENCOUNTER — Encounter (INDEPENDENT_AMBULATORY_CARE_PROVIDER_SITE_OTHER): Payer: Self-pay | Admitting: Family

## 2021-01-23 ENCOUNTER — Other Ambulatory Visit: Payer: Self-pay

## 2021-01-23 ENCOUNTER — Ambulatory Visit (INDEPENDENT_AMBULATORY_CARE_PROVIDER_SITE_OTHER): Payer: Medicaid Other | Admitting: Family

## 2021-01-23 VITALS — BP 128/84 | HR 122 | Wt 154.4 lb

## 2021-01-23 DIAGNOSIS — F70 Mild intellectual disabilities: Secondary | ICD-10-CM

## 2021-01-23 DIAGNOSIS — G40309 Generalized idiopathic epilepsy and epileptic syndromes, not intractable, without status epilepticus: Secondary | ICD-10-CM

## 2021-01-23 MED ORDER — LAMOTRIGINE 25 MG PO TABS: ORAL_TABLET | ORAL | 5 refills | Status: DC

## 2021-01-23 NOTE — Progress Notes (Signed)
Benjamin Wagner   MRN:  [id number]  11/15/1995   Provider: Elveria Rising NP-C Location of Care: [facility]  Visit type: follow up  Last visit: 11/24/2019 Referral source: Ivory Broad, MD History from: Patient, mom, [facility] Chart.  Brief history:  Copied from previous record: History of generalized convulsive seizures and mild cognitive impairment. He is taking and tolerating Lamotrigine for his seizure disorder, and has occasional brief breakthrough seizures with missed doses of medication. He is at home with his mother and needs reminders to take medication and for some activities of daily living. He is not employed. There are no problems with behavior.   Today's concerns: Mom reports today that [name] has remained seizure free since his last visit. He is generally compliant with medication and gets at least 8 hours of sleep each night. He lives at home with his mother and needs reminders to take medication and do other activities of daily living. He does some simple chores around the house.   Benjamin Wagner has been otherwise generally healthy since he was last seen. Mom has no other health concerns for him today other than previously mentioned.  Review of systems: Please see HPI for neurologic and other pertinent review of systems. Otherwise all other systems were reviewed and were negative.  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 This occurred at rest, with hyperventilation and has further myoclonic responses. He had a seizure in April 2007 with left focal motor activity with  secondary generalization. On February 24, 2008, the patient had repeat EEG, which again showed frequent burst of generalized four hertz spike and slow wave activity in single bursts and in clusters lasting up to two seconds in duration. The episodes did not occur during photic stimulation and were exacerbated by hyperventilation. His background was otherwise normal. He had a repeat EEG on October 08, 2012, that showed three episodes of generalized regularly contoured spike and slow wave activity followed by two episodes of four hertz regularly contoured spike and slow wave activity that was frontally prominent, but generalized. The episodes were lasted for a second or less. Background activity was otherwise normal.    The patient was initially placed on Lamictal because of his abnormal EEG and the potential for having further seizures. In 2009, he was on 25 mg twice daily. He had a normal examination. Plans were made to perform an EEG at [facility] and to consider tapering and discontinuing the medications. He was lost to follow up and when seen again was taking Lamotrigine at a dose of 25 mg in the morning and 50 mg in the afternoon and 75 mg at nighttime.    Other medical problems for this young man included acne, hypercholesterolemia, and intellectual disability. For reasons that are unclear, he had been placed on metformin, though he was not obese. He had normal blood sugar and hemoglobin A1c of 5.8.    In high school, he was in the self-maintained life skills class and participated in [place]. He has an older brother with cognitive impairment and autism. His IQ has been measured as full scale IQ  41 and verbal 45.    Surgical history: Past Surgical History:  Procedure Laterality Date   CIRCUMCISION  1996     Family history: family history is not on file.   Social history: Social History   Socioeconomic History   Marital status: Single    Spouse name: Not on file   Number of children: Not  on file   Years of education: Not on file   Highest education level: Not on file  Occupational History   Not on file  Tobacco Use   Smoking status: Passive Smoke Exposure - Never Smoker   Smokeless tobacco: Never   Tobacco comments:    Mom smokes   Substance and Sexual Activity   Alcohol use: No    Alcohol/week: 0.0 standard drinks   Drug use: No   Sexual activity: Never  Other Topics Concern   Not on file  Social History Narrative   Benjamin Wagner graduated from [employer] in 2015. He enjoys playing computer games.   Lives with his mother.    Wants to take some classes at [employer]   Social Determinants of Health   Financial Resource Strain: Not on file  Food Insecurity: Not on file  Transportation Needs: Not on file  Physical Activity: Not on file  Stress: Not on file  Social Connections: Not on file  Intimate Partner Violence: Not on file    Past/failed meds:  Allergies: No Known Allergies   Immunizations:  There is no immunization history on file for this patient.    Diagnostics/Screenings: Copied from previous record: rEEG 10/09/2012 - Abnormal EEG on the basis of epileptiform activity that is interictal and epileptogenic from electrographic viewpoint correlating with a primary generalized seizure disorder.  The findings suggest the patient is still at risk for recurrent seizures.  Deanna Artis. Sharene Skeans, M.D.  Physical Exam: BP 128/84   Pulse (!) 122   Wt 154 lb 6.4 oz (70 kg)   BMI 23.13 kg/m   General: Well developed, well nourished young man, seated on exam table, in no evident distress, black hair, brown eyes, right handed Head: Head normocephalic and atraumatic.  Oropharynx benign. Neck: Supple Cardiovascular: Regular rate and rhythm, no murmurs Respiratory: Breath sounds clear to auscultation Musculoskeletal: No obvious deformities or scoliosis Skin: No rashes or neurocutaneous lesions  Neurologic Exam Mental Status: Awake and fully alert.   Oriented to place and time.  Attention span and concentration appropriate, but fund of knowledge subnormal for age. He needed coaching and redirection to follow instructions for examination. Mood and affect appropriate. Cranial Nerves: Fundoscopic exam reveals sharp disc margins.  Pupils equal, briskly reactive to light.  Extraocular movements full without nystagmus. Hearing intact and symmetric to voice.  Facial sensation intact.  Face tongue, palate move normally and symmetrically. Motor: Normal bulk and tone. Normal strength in all tested extremity muscles. Sensory: Intact to touch and temperature in all extremities.  Coordination: Rapid alternating movements normal in all extremities.  Finger-to-nose and heel-to shin performed accurately bilaterally.  Romberg negative. Gait and Station: Arises from chair without difficulty.  Stance is normal. Gait demonstrates normal stride length and balance. Had difficulty following instructions for heel, toe and tandem walk. Reflexes: 1+ and symmetric. Toes downgoing.   Impression: Epilepsy, generalized, convulsive (HCC) - Plan: lamoTRIgine (LAMICTAL) 25 MG tablet  Mild intellectual disability   Recommendations for plan of care: The patient's previous [facility] records were reviewed. Rayne has neither had nor required imaging or lab studies since the last visit.  He is a 25 year old man with history of seizures and intellectual disability. He is taking and tolerating Lamotrigine and has remained seizure free since his last visit. I reminded Dinero of the need for him to be compliant with medication and to get at least 8 hours of sleep each night. I asked his mother to let me know if he has breakthrough seizures. I will see him back in follow up in 1 year or sooner if needed.   The medication list was reviewed and reconciled. No changes were made in the prescribed medications today. A complete medication list was provided to the patient.  Return in about  1 year (around 01/23/2022).   Allergies as of 01/23/2021   No Known Allergies      Medication List        Accurate as of January 23, 2021 11:59 PM. If you have any questions, ask your nurse or doctor.          lamoTRIgine 25 MG tablet Commonly known as: LAMICTAL TAKE 1 TABLET BY MOUTH EVERY MORNING, 2 TABLETS MIDDAY AND 3 TABLETS EVERY NIGHT AT BEDTIME        Total time spent with the patient was 20 minutes, of which 50% or more was spent in counseling and coordination of care.  Elveria Rising NP-C [facility] Ph. [phone] Fax [phone]

## 2021-01-28 ENCOUNTER — Encounter (INDEPENDENT_AMBULATORY_CARE_PROVIDER_SITE_OTHER): Payer: Self-pay | Admitting: Family

## 2021-01-28 NOTE — Patient Instructions (Signed)
Thank you for coming in today.   Instructions for you until your next appointment are as follows: Continue to take Lamotrigine as prescribed Try not to miss any doses Be sure to get at least 8 hours of sleep each night Please let me know if you have seizures Please sign up for MyChart if you have not done so.\ Please plan to return for follow up in one year or sooner if needed.  At [facility], we are committed to providing exceptional care. You will receive a patient satisfaction survey through text or email regarding your visit today. Your opinion is important to me. Comments are appreciated.

## 2021-02-07 ENCOUNTER — Other Ambulatory Visit (INDEPENDENT_AMBULATORY_CARE_PROVIDER_SITE_OTHER): Payer: Self-pay | Admitting: Family

## 2021-02-07 DIAGNOSIS — G40309 Generalized idiopathic epilepsy and epileptic syndromes, not intractable, without status epilepticus: Secondary | ICD-10-CM

## 2021-07-06 ENCOUNTER — Other Ambulatory Visit (INDEPENDENT_AMBULATORY_CARE_PROVIDER_SITE_OTHER): Payer: Self-pay | Admitting: Family

## 2021-07-06 DIAGNOSIS — G40309 Generalized idiopathic epilepsy and epileptic syndromes, not intractable, without status epilepticus: Secondary | ICD-10-CM

## 2021-08-28 ENCOUNTER — Other Ambulatory Visit (INDEPENDENT_AMBULATORY_CARE_PROVIDER_SITE_OTHER): Payer: Self-pay | Admitting: Family

## 2021-08-28 DIAGNOSIS — G40309 Generalized idiopathic epilepsy and epileptic syndromes, not intractable, without status epilepticus: Secondary | ICD-10-CM

## 2021-08-28 MED ORDER — LAMOTRIGINE 25 MG PO TABS: ORAL_TABLET | ORAL | 4 refills | Status: DC

## 2022-07-01 ENCOUNTER — Other Ambulatory Visit (INDEPENDENT_AMBULATORY_CARE_PROVIDER_SITE_OTHER): Payer: Self-pay | Admitting: Family

## 2022-07-01 DIAGNOSIS — G40309 Generalized idiopathic epilepsy and epileptic syndromes, not intractable, without status epilepticus: Secondary | ICD-10-CM

## 2022-07-29 ENCOUNTER — Other Ambulatory Visit (INDEPENDENT_AMBULATORY_CARE_PROVIDER_SITE_OTHER): Payer: Self-pay | Admitting: Family

## 2022-07-29 DIAGNOSIS — G40309 Generalized idiopathic epilepsy and epileptic syndromes, not intractable, without status epilepticus: Secondary | ICD-10-CM

## 2022-07-29 NOTE — Telephone Encounter (Signed)
Last seen 01/2021 follow up was due 01/2022 not been sched last request Benjamin Wagner stated further refills require an OV and appointment has not been made VM left 07/11/22 to call and sched appt

## 2022-08-02 ENCOUNTER — Other Ambulatory Visit (INDEPENDENT_AMBULATORY_CARE_PROVIDER_SITE_OTHER): Payer: Self-pay | Admitting: Family

## 2022-08-02 DIAGNOSIS — G40309 Generalized idiopathic epilepsy and epileptic syndromes, not intractable, without status epilepticus: Secondary | ICD-10-CM

## 2022-08-02 NOTE — Telephone Encounter (Signed)
Patient needs an appointment in order to receive refills. He was last seen 01/2021. TG

## 2022-08-22 ENCOUNTER — Encounter (INDEPENDENT_AMBULATORY_CARE_PROVIDER_SITE_OTHER): Payer: Self-pay | Admitting: Family

## 2022-08-22 ENCOUNTER — Ambulatory Visit (INDEPENDENT_AMBULATORY_CARE_PROVIDER_SITE_OTHER): Payer: Medicaid Other | Admitting: Family

## 2022-08-22 VITALS — BP 100/80 | HR 97 | Ht 68.62 in | Wt 128.0 lb

## 2022-08-22 DIAGNOSIS — G40309 Generalized idiopathic epilepsy and epileptic syndromes, not intractable, without status epilepticus: Secondary | ICD-10-CM

## 2022-08-22 DIAGNOSIS — F70 Mild intellectual disabilities: Secondary | ICD-10-CM | POA: Diagnosis not present

## 2022-08-22 MED ORDER — LAMOTRIGINE 25 MG PO TABS: ORAL_TABLET | ORAL | 5 refills | Status: DC

## 2022-08-22 NOTE — Progress Notes (Signed)
Benjamin Wagner   MRN:  [id number]  1995/11/06   Provider: Rockwell Germany NP-C Location of Care: [facility]  Visit type: Return visit  Last visit: 01/23/2021  Referral source: Angeline Slim, MD History from: Epic chart and patient's grandmother  Brief history:  Copied from previous record: History of generalized convulsive seizures and mild cognitive impairment. He is taking and tolerating Lamotrigine for his seizure disorder, and has occasional brief breakthrough seizures with missed doses of medication. He is at home with his mother and needs reminders to take medication and for some activities of daily living. He is not employed. There are no problems with behavior.    Today's concerns: Has remained seizure free since last visit Compliant with medication and usually gets at least 8 hours of sleep each nigh Benjamin Wagner has been otherwise generally healthy since he was last seen. No health concerns today other than previously mentioned.  Review of systems: Please see HPI for neurologic and other pertinent review of systems. Otherwise all other systems were reviewed and were negative.  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 This occurred at rest, with hyperventilation and has further myoclonic responses. He had a seizure in April 2007 with left focal motor activity with secondary generalization. On February 24, 2008, the patient had repeat EEG, which again showed frequent burst of generalized four hertz spike and slow wave activity in single bursts and in clusters lasting up to two  seconds in duration. The episodes did not occur during photic stimulation and were exacerbated by hyperventilation. His background was otherwise normal. He had a repeat EEG on October 08, 2012, that showed three episodes of generalized regularly contoured spike and slow wave activity followed by two episodes of four hertz regularly contoured spike and slow wave activity that was frontally prominent, but generalized. The episodes were lasted for a second or less. Background activity was otherwise normal.    The patient was initially placed on Lamictal because of his abnormal EEG and the potential for having further seizures. In 2009, he was on 25 mg twice daily. He had a normal examination. Plans were made to perform an EEG at [facility] and to consider tapering and discontinuing the medications. He was lost to follow up and when seen again was taking Lamotrigine at a dose of 25 mg in the morning and 50 mg in the afternoon and 75 mg at nighttime.    Other medical problems for this young man included acne, hypercholesterolemia, and intellectual disability. For reasons that are unclear, he had been placed on metformin, though he was not obese. He had normal blood sugar and hemoglobin A1c of 5.8.    In high school, he was in the self-maintained life skills class and participated in [place]. He has an older brother with cognitive impairment and autism. His IQ has been measured as full scale IQ 31 and verbal 24.     Surgical history: Past Surgical History:  Procedure Laterality Date   CIRCUMCISION  1996     Family history: family history is not on file.   Social history: Social  History   Socioeconomic History   Marital status: Single    Spouse name: Not on file   Number of children: Not on file   Years of education: Not on file   Highest education level: Not on file  Occupational History   Not on file  Tobacco Use   Smoking status: Never    Passive exposure: Yes   Smokeless tobacco:  Never   Tobacco comments:    Mom smokes   Substance and Sexual Activity   Alcohol use: No    Alcohol/week: 0.0 standard drinks of alcohol   Drug use: No   Sexual activity: Never  Other Topics Concern   Not on file  Social History Narrative   Benjamin Wagner graduated from [employer] in 2015. He enjoys playing computer games.   Lives with his mother.    Wants to take some classes at [employer] Determinants of Health   Financial Resource Strain: Not on file  Food Insecurity: Not on file  Transportation Needs: Not on file  Physical Activity: Not on file  Stress: Not on file  Social Connections: Not on file  Intimate Partner Violence: Not on file    Past/failed meds:  Allergies: No Known Allergies   Immunizations:  There is no immunization history on file for this patient.    Diagnostics/Screenings: Copied from previous record: rEEG 10/09/2012 - Abnormal EEG on the basis of epileptiform activity that is interictal and epileptogenic from electrographic viewpoint correlating with a primary generalized seizure disorder.  The findings suggest the patient is still at risk for recurrent seizures.  Benjamin Wagner, M.D  Physical Exam: BP 100/80 (BP Location: Left Arm, Patient Position: Sitting, Cuff Size: Normal)   Pulse 97   Ht 5' 8.62" (1.743 m)   Wt 128 lb (58.1 kg)   BMI 19.11 kg/m   General: Well developed, well nourished man, seated on exam table, in no evident distress Head: normocephalic and atraumatic.  Oropharynx benign. Neck: Supple Cardiovascular: Regular rate and rhythm, no murmurs Respiratory: Breath sounds clear to auscultation Musculoskeletal: No obvious deformities or scoliosis Skin: No rashes or neurocutaneous lesions  Neurologic Exam Mental Status: Awake and fully alert.  Oriented to place and time.  Attention span, concentration, and fund of knowledge subnormal for age. Needed some coaching and redirection.  Mood and affect  appropriate. Cranial Nerves: Fundoscopic exam reveals sharp disc margins.  Pupils equal, briskly reactive to light.  Extraocular movements full without nystagmus. Hearing intact and symmetric to whisper.  Facial sensation intact.  Face tongue, palate move normally and symmetrically. Shoulder shrug normal Motor: Normal bulk and tone. Normal strength in all tested extremity muscles. Sensory: Intact to touch and temperature in all extremities.  Coordination: Rapid alternating movements normal in all extremities.  Finger-to-nose and heel-to shin performed accurately bilaterally.  Romberg negative. Gait and Station: Arises from chair without difficulty.  Stance is normal. Gait demonstrates normal stride length and balance.   Able to heel, toe and tandem walk without difficulty. Reflexes: 1+ and symmetric. Toes downgoing.   Impression: Epilepsy, generalized, convulsive ([place]) - Plan: lamoTRIgine (LAMICTAL) 25 MG tablet  Mild intellectual disability   Recommendations for plan of care: The patient's previous Epic records were reviewed. No recent diagnostic studies to be reviewed with the patient.  Plan until next visit: Continue medications as prescribed  Reminded to be compliant with medication and to get at least 8 hours of sleep each night as sleep deprivation can trigger seizures. Call if  seizures occur Return in about 1 year (around 08/23/2023).  The medication list was reviewed and reconciled. No changes were made in the prescribed medications today. A complete medication list was provided to the patient.  Allergies as of 08/22/2022   No Known Allergies      Medication List        Accurate as of August 22, 2022 11:59 PM. If you have any questions, ask your nurse or doctor.          lamoTRIgine 25 MG tablet Commonly known as: LAMICTAL TAKE 1 TABLET BY MOUTH EVERY MORNING, 2 TABLETS BY MOUTH MIDDAY, AND 3 TABLETS BY MOUTH EVERY NIGHT AT BEDTIME      Total time spent with the  patient was 20 minutes, of which 50% or more was spent in counseling and coordination of care.  Rockwell Germany NP-C [place] [facility] [id number] [address], [place], [place] [address] Ph. [phone] Fax [phone]

## 2022-08-23 ENCOUNTER — Encounter (INDEPENDENT_AMBULATORY_CARE_PROVIDER_SITE_OTHER): Payer: Self-pay | Admitting: Family

## 2022-08-23 NOTE — Patient Instructions (Signed)
It was a pleasure to see you today!  Instructions for you until your next appointment are as follows: Continue taking your medications as prescribed Remember to try not to miss any doses of medication and to get at least 8 hours of sleep each night Call if seizures occur Please sign up for MyChart if you have not done so. Please plan to return for follow up in one year or sooner if needed.  Feel free to contact our office during normal business hours at [phone] with questions or concerns. If there is no answer or the call is outside business hours, please leave a message and our clinic staff will call you back within the next business day.  If you have an urgent concern, please stay on the line for our after-hours answering service and ask for the on-call neurologist.     I also encourage you to use MyChart to communicate with me more directly. If you have not yet signed up for MyChart within [facility], the front desk staff can help you. However, please note that this inbox is NOT monitored on nights or weekends, and response can take up to 2 business days.  Urgent matters should be discussed with the on-call pediatric neurologist.   At [facility], we are committed to providing exceptional care. You will receive a patient satisfaction survey through text or email regarding your visit today. Your opinion is important to me. Comments are appreciated.

## 2022-11-25 ENCOUNTER — Other Ambulatory Visit (INDEPENDENT_AMBULATORY_CARE_PROVIDER_SITE_OTHER): Payer: Self-pay | Admitting: Family

## 2022-11-25 DIAGNOSIS — G40309 Generalized idiopathic epilepsy and epileptic syndromes, not intractable, without status epilepticus: Secondary | ICD-10-CM

## 2023-04-08 ENCOUNTER — Other Ambulatory Visit (INDEPENDENT_AMBULATORY_CARE_PROVIDER_SITE_OTHER): Payer: Self-pay | Admitting: Family

## 2023-04-08 DIAGNOSIS — G40309 Generalized idiopathic epilepsy and epileptic syndromes, not intractable, without status epilepticus: Secondary | ICD-10-CM

## 2023-07-24 ENCOUNTER — Telehealth (INDEPENDENT_AMBULATORY_CARE_PROVIDER_SITE_OTHER): Payer: Self-pay | Admitting: Family

## 2023-07-24 NOTE — Telephone Encounter (Signed)
 I attempted to call Mom to notify her of a change in Benjamin Wagner's upcoming appointment in February. I was unable to reach her by phone and left a message. I will also mail her a letter. TG

## 2023-08-28 ENCOUNTER — Encounter (INDEPENDENT_AMBULATORY_CARE_PROVIDER_SITE_OTHER): Payer: Self-pay | Admitting: Family

## 2023-08-28 ENCOUNTER — Ambulatory Visit (INDEPENDENT_AMBULATORY_CARE_PROVIDER_SITE_OTHER): Payer: Self-pay | Admitting: Family

## 2023-08-28 ENCOUNTER — Ambulatory Visit (INDEPENDENT_AMBULATORY_CARE_PROVIDER_SITE_OTHER): Payer: MEDICAID | Admitting: Family

## 2023-08-28 VITALS — BP 130/80 | HR 84 | Ht 68.5 in | Wt 134.0 lb

## 2023-08-28 DIAGNOSIS — G40309 Generalized idiopathic epilepsy and epileptic syndromes, not intractable, without status epilepticus: Secondary | ICD-10-CM | POA: Diagnosis not present

## 2023-08-28 DIAGNOSIS — F70 Mild intellectual disabilities: Secondary | ICD-10-CM

## 2023-08-28 MED ORDER — LAMOTRIGINE 25 MG PO TABS: ORAL_TABLET | ORAL | 5 refills | Status: DC

## 2023-08-28 NOTE — Progress Notes (Unsigned)
Benjamin Wagner   MRN:  [id number]  05/02/1996   Provider: Elveria Rising NP-C Location of Care: [facility]  Visit type: Return visit  Last visit: 08/22/2022  Referral source: Christel Mormon, MD History from: Epic chart, patient and his grandmother  Brief history:  Copied from previous record: History of generalized convulsive seizures and mild cognitive impairment. He is taking and tolerating Lamotrigine for his seizure disorder, and has occasional brief breakthrough seizures with missed doses of medication. He is at home with his mother and needs reminders to take medication and for some activities of daily living. He is not employed. There are no problems with behavior.   Today's concerns: He has remained seizure free since his last visit.  He has a good appetite and usually sleeps well at night Benjamin Wagner has been otherwise generally healthy since he was last seen. No health concerns today other than previously mentioned.  Review of systems: Please see HPI for neurologic and other pertinent review of systems. Otherwise all other systems were reviewed and were negative.  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 This occurred at rest, with hyperventilation and has further myoclonic responses. He had a seizure in April 2007 with left focal motor activity with secondary generalization. On February 24, 2008, the patient had repeat EEG, which again showed frequent burst of  generalized four hertz spike and slow wave activity in single bursts and in clusters lasting up to two seconds in duration. The episodes did not occur during photic stimulation and were exacerbated by hyperventilation. His background was otherwise normal. He had a repeat EEG on October 08, 2012, that showed three episodes of generalized regularly contoured spike and slow wave activity followed by two episodes of four hertz regularly contoured spike and slow wave activity that was frontally prominent, but generalized. The episodes were lasted for a second or less. Background activity was otherwise normal.    The patient was initially placed on Lamictal because of his abnormal EEG and the potential for having further seizures. In 2009, he was on 25 mg twice daily. He had a normal examination. Plans were made to perform an EEG at [facility] and to consider tapering and discontinuing the medications. He was lost to follow up and when seen again was taking Lamotrigine at a dose of 25 mg in the morning and 50 mg in the afternoon and 75 mg at nighttime.    Other medical problems for this young man included acne, hypercholesterolemia, and intellectual disability. For reasons that are unclear, he had been placed on metformin, though he was not obese. He had normal blood sugar and hemoglobin A1c of 5.8.    In high school, he was in the self-maintained life skills class and participated in [place]. He has an older brother with cognitive impairment and autism. His IQ has been measured as full scale IQ 41 and verbal 45.    Surgical history: Past Surgical History:  Procedure Laterality Date   CIRCUMCISION  1996  Family history: family history is not on file.   Social history: Social History   Socioeconomic History   Marital status: Single    Spouse name: Not on file   Number of children: Not on file   Years of education: Not on file   Highest education level: Not on file  Occupational History   Not  on file  Tobacco Use   Smoking status: Never    Passive exposure: Yes   Smokeless tobacco: Never   Tobacco comments:    Mom smokes   Substance and Sexual Activity   Alcohol use: No    Alcohol/week: 0.0 standard drinks of alcohol   Drug use: No   Sexual activity: Never  Other Topics Concern   Not on file  Social History Narrative   Benjamin Wagner graduated from [employer] in 2015. He enjoys playing computer games.   Lives with his mother.    Wants to take some classes at [employer]   Social Drivers of Health   Financial Resource Strain: Not on file  Food Insecurity: Not on file  Transportation Needs: Not on file  Physical Activity: Not on file  Stress: Not on file  Social Connections: Not on file  Intimate Partner Violence: Not on file    Past/failed meds:  Allergies: No Known Allergies   Immunizations:  There is no immunization history on file for this patient.   Diagnostics/Screenings: Copied from previous record: rEEG 10/09/2012 - Abnormal EEG on the basis of epileptiform activity that is interictal and epileptogenic from electrographic viewpoint correlating with a primary generalized seizure disorder.  The findings suggest the patient is still at risk for recurrent seizures.  Deanna Artis. Hickling, M.D  Physical Exam: BP 130/80 (BP Location: Left Arm, Patient Position: Sitting, Cuff Size: Normal)   Pulse 84   Ht 5' 8.5" (1.74 m)   Wt 134 lb (60.8 kg)   BMI 20.08 kg/m   General: Well developed, well nourished man, seated on exam table, in no evident distress Head: Head normocephalic and atraumatic.  Oropharynx benign. Neck: Supple Cardiovascular: Regular rate and rhythm, no murmurs Respiratory: Breath sounds clear to auscultation Musculoskeletal: No obvious deformities or scoliosis Skin: No rashes or neurocutaneous lesions  Neurologic Exam Mental Status: Awake and fully alert.  Oriented to place and time.  Attention span, concentration, and fund of  knowledge subnormal for age.  Needed some redirection at times. Mood and affect appropriate. Cranial Nerves: Fundoscopic exam reveals sharp disc margins.  Pupils equal, briskly reactive to light.  Extraocular movements full without nystagmus. Hearing intact and symmetric to whisper.  Facial sensation intact.  Face tongue, palate move normally and symmetrically. Shoulder shrug normal Motor: Normal bulk and tone. Normal strength in all tested extremity muscles. Sensory: Intact to touch and temperature in all extremities.  Coordination: Rapid alternating movements normal in all extremities.  Finger-to-nose and heel-to shin performed accurately bilaterally.  Romberg negative. Gait and Station: Arises from chair without difficulty.  Stance is normal. Gait demonstrates normal stride length and balance.   Able to heel, toe and tandem walk without difficulty.  Impression: Epilepsy, generalized, convulsive (HCC) - Plan: lamoTRIgine (LAMICTAL) 25 MG tablet  Mild intellectual disability   Recommendations for plan of care: The patient's previous Epic records were reviewed. No recent diagnostic studies to be reviewed with the patient.  Plan until next visit: Continue medications as prescribed  Call for questions or concerns Return in about 1 year (around 08/27/2024).  The medication list was reviewed and  reconciled. No changes were made in the prescribed medications today. A complete medication list was provided to the patient.  Allergies as of 08/28/2023   No Known Allergies      Medication List        Accurate as of August 28, 2023 11:59 PM. If you have any questions, ask your nurse or doctor.          lamoTRIgine 25 MG tablet Commonly known as: LAMICTAL TAKE 1 TABLET BY MOUTH EVERY MORNING, THEN 2 TABLETS BY MOUTH MIDDAY, AND 3 TABLETS EVERY NIGHT AT BEDTIME      Total time spent with the patient was 25 minutes, of which 50% or more was spent in counseling and coordination of  care.  Elveria Rising NP-C [facility] [address] [place], [place] [address] Ph. [phone] Fax [phone]

## 2023-08-28 NOTE — Patient Instructions (Addendum)
It was a pleasure to see you today!  Instructions for you until your next appointment are as follows: Continue taking your medication as prescribed Let me know if you have any seizures Call for any questions or concerns Please sign up for MyChart if you have not done so. Please plan to return for follow up in 1 year or sooner if needed.  Feel free to contact our office during normal business hours at [phone] with questions or concerns. If there is no answer or the call is outside business hours, please leave a message and our clinic staff will call you back within the next business day.  If you have an urgent concern, please stay on the line for our after-hours answering service and ask for the on-call neurologist.     I also encourage you to use MyChart to communicate with me more directly. If you have not yet signed up for MyChart within [facility], the front desk staff can help you. However, please note that this inbox is NOT monitored on nights or weekends, and response can take up to 2 business days.  Urgent matters should be discussed with the on-call pediatric neurologist.   At [facility], we are committed to providing exceptional care. You will receive a patient satisfaction survey through text or email regarding your visit today. Your opinion is important to me. Comments are appreciated.

## 2023-08-29 ENCOUNTER — Ambulatory Visit (INDEPENDENT_AMBULATORY_CARE_PROVIDER_SITE_OTHER): Payer: Self-pay | Admitting: Family

## 2023-08-29 ENCOUNTER — Encounter (INDEPENDENT_AMBULATORY_CARE_PROVIDER_SITE_OTHER): Payer: Self-pay | Admitting: Family

## 2023-10-07 ENCOUNTER — Telehealth (INDEPENDENT_AMBULATORY_CARE_PROVIDER_SITE_OTHER): Payer: Self-pay | Admitting: Family

## 2023-10-07 NOTE — Telephone Encounter (Signed)
 Paperwork was dropped off for Elveria Rising to fill out. Placed in Tina's box.

## 2023-12-11 ENCOUNTER — Telehealth (INDEPENDENT_AMBULATORY_CARE_PROVIDER_SITE_OTHER): Payer: Self-pay | Admitting: Family

## 2023-12-11 NOTE — Telephone Encounter (Signed)
 You can contact Christine at [phone] to  pick up the letter

## 2023-12-11 NOTE — Telephone Encounter (Signed)
 Benjamin Wagner is needing an official diagnosis letter. Benjamin Wagner will pick it up once completed.

## 2023-12-12 NOTE — Telephone Encounter (Signed)
 error

## 2023-12-12 NOTE — Telephone Encounter (Signed)
 Letter has been printed. Please let Mom know. Thanks, Brian Campanile

## 2023-12-12 NOTE — Telephone Encounter (Signed)
 Attempted to contact patients mother to inform her of the letter being ready.  Mother unable to be reached.  LVM to call back.   SS, CCMA

## 2024-04-27 ENCOUNTER — Other Ambulatory Visit (INDEPENDENT_AMBULATORY_CARE_PROVIDER_SITE_OTHER): Payer: Self-pay | Admitting: Family

## 2024-04-27 DIAGNOSIS — G40309 Generalized idiopathic epilepsy and epileptic syndromes, not intractable, without status epilepticus: Secondary | ICD-10-CM

## 2024-06-08 ENCOUNTER — Emergency Department (HOSPITAL_COMMUNITY): Payer: MEDICAID

## 2024-06-08 ENCOUNTER — Inpatient Hospital Stay (HOSPITAL_COMMUNITY)
Admission: EM | Admit: 2024-06-08 | Discharge: 2024-06-10 | DRG: 637 | Disposition: A | Discharge status: Home / Self Care | Payer: MEDICAID | Attending: Internal Medicine | Admitting: Internal Medicine

## 2024-06-08 ENCOUNTER — Encounter (HOSPITAL_COMMUNITY): Payer: Self-pay | Admitting: Radiology

## 2024-06-08 ENCOUNTER — Other Ambulatory Visit: Payer: Self-pay

## 2024-06-08 DIAGNOSIS — Z794 Long term (current) use of insulin: Secondary | ICD-10-CM

## 2024-06-08 DIAGNOSIS — Z833 Family history of diabetes mellitus: Secondary | ICD-10-CM

## 2024-06-08 DIAGNOSIS — E109 Type 1 diabetes mellitus without complications: Secondary | ICD-10-CM | POA: Diagnosis not present

## 2024-06-08 DIAGNOSIS — G40409 Other generalized epilepsy and epileptic syndromes, not intractable, without status epilepticus: Secondary | ICD-10-CM | POA: Diagnosis present

## 2024-06-08 DIAGNOSIS — E0810 Diabetes mellitus due to underlying condition with ketoacidosis without coma: Principal | ICD-10-CM

## 2024-06-08 DIAGNOSIS — N179 Acute kidney failure, unspecified: Secondary | ICD-10-CM

## 2024-06-08 DIAGNOSIS — F70 Mild intellectual disabilities: Secondary | ICD-10-CM | POA: Diagnosis present

## 2024-06-08 DIAGNOSIS — E860 Dehydration: Secondary | ICD-10-CM | POA: Diagnosis present

## 2024-06-08 DIAGNOSIS — E1110 Type 2 diabetes mellitus with ketoacidosis without coma: Secondary | ICD-10-CM | POA: Diagnosis present

## 2024-06-08 DIAGNOSIS — N170 Acute kidney failure with tubular necrosis: Secondary | ICD-10-CM | POA: Diagnosis present

## 2024-06-08 DIAGNOSIS — G40309 Generalized idiopathic epilepsy and epileptic syndromes, not intractable, without status epilepticus: Secondary | ICD-10-CM | POA: Diagnosis not present

## 2024-06-08 DIAGNOSIS — E1010 Type 1 diabetes mellitus with ketoacidosis without coma: Secondary | ICD-10-CM | POA: Diagnosis present

## 2024-06-08 LAB — GLUCOSE, CAPILLARY
Glucose-Capillary: 283 mg/dL — ABNORMAL HIGH (ref 70–99)
Glucose-Capillary: 235 mg/dL — ABNORMAL HIGH (ref 70–99)
Glucose-Capillary: 195 mg/dL — ABNORMAL HIGH (ref 70–99)
Glucose-Capillary: 208 mg/dL — ABNORMAL HIGH (ref 70–99)
Glucose-Capillary: 211 mg/dL — ABNORMAL HIGH (ref 70–99)
Glucose-Capillary: 197 mg/dL — ABNORMAL HIGH (ref 70–99)
Glucose-Capillary: 151 mg/dL — ABNORMAL HIGH (ref 70–99)
Glucose-Capillary: 142 mg/dL — ABNORMAL HIGH (ref 70–99)

## 2024-06-08 LAB — CBC
HCT: 51.3 % (ref 39.0–52.0)
Hemoglobin: 17.1 g/dL — ABNORMAL HIGH (ref 13.0–17.0)
MCH: 31.8 pg (ref 26.0–34.0)
MCHC: 33.3 g/dL (ref 30.0–36.0)
MCV: 95.4 fL (ref 80.0–100.0)
Platelets: 232 K/uL (ref 150–400)
RBC: 5.38 MIL/uL (ref 4.22–5.81)
RDW: 12.3 % (ref 11.5–15.5)
WBC: 16.2 K/uL — ABNORMAL HIGH (ref 4.0–10.5)
nRBC: 0 % (ref 0.0–0.2)

## 2024-06-08 LAB — URINALYSIS, ROUTINE W REFLEX MICROSCOPIC
Bacteria, UA: NONE SEEN
Bilirubin Urine: NEGATIVE
Glucose, UA: >=500 mg/dL — AB
Ketones, ur: 80 mg/dL — AB
Leukocytes,Ua: NEGATIVE
Nitrite: NEGATIVE
Protein, ur: 30 mg/dL — AB
Specific Gravity, Urine: 1.027 (ref 1.005–1.030)
pH: 5 (ref 5.0–8.0)

## 2024-06-08 LAB — BASIC METABOLIC PANEL WITH GFR
Anion gap: 25 — ABNORMAL HIGH (ref 5–15)
Anion gap: 19 — ABNORMAL HIGH (ref 5–15)
Anion gap: 15 (ref 5–15)
BUN: 27 mg/dL — ABNORMAL HIGH (ref 6–20)
BUN: 23 mg/dL — ABNORMAL HIGH (ref 6–20)
BUN: 20 mg/dL (ref 6–20)
CO2: 11 mmol/L — ABNORMAL LOW (ref 22–32)
CO2: 18 mmol/L — ABNORMAL LOW (ref 22–32)
CO2: 22 mmol/L (ref 22–32)
Calcium: 9.1 mg/dL (ref 8.9–10.3)
Calcium: 9.2 mg/dL (ref 8.9–10.3)
Calcium: 9.4 mg/dL (ref 8.9–10.3)
Chloride: 109 mmol/L (ref 98–111)
Chloride: 110 mmol/L (ref 98–111)
Chloride: 114 mmol/L — ABNORMAL HIGH (ref 98–111)
Creatinine, Ser: 1.28 mg/dL — ABNORMAL HIGH (ref 0.61–1.24)
Creatinine, Ser: 1.24 mg/dL (ref 0.61–1.24)
Creatinine, Ser: 1.12 mg/dL (ref 0.61–1.24)
GFR, Estimated: >60 mL/min (ref >60)
GFR, Estimated: >60 mL/min (ref >60)
GFR, Estimated: >60 mL/min (ref >60)
Glucose, Bld: 259 mg/dL — ABNORMAL HIGH (ref 70–99)
Glucose, Bld: 226 mg/dL — ABNORMAL HIGH (ref 70–99)
Glucose, Bld: 164 mg/dL — ABNORMAL HIGH (ref 70–99)
Potassium: 4.1 mmol/L (ref 3.5–5.1)
Potassium: 4.5 mmol/L (ref 3.5–5.1)
Potassium: 4.2 mmol/L (ref 3.5–5.1)
Sodium: 145 mmol/L (ref 135–145)
Sodium: 147 mmol/L — ABNORMAL HIGH (ref 135–145)
Sodium: 150 mmol/L — ABNORMAL HIGH (ref 135–145)

## 2024-06-08 LAB — CBC WITH DIFFERENTIAL/PLATELET
Abs Immature Granulocytes: 0.11 K/uL — ABNORMAL HIGH (ref 0.00–0.07)
Basophils Absolute: 0 K/uL (ref 0.0–0.1)
Basophils Relative: 0 %
Eosinophils Absolute: 0 K/uL (ref 0.0–0.5)
Eosinophils Relative: 0 %
HCT: 55.6 % — ABNORMAL HIGH (ref 39.0–52.0)
Hemoglobin: 18.2 g/dL — ABNORMAL HIGH (ref 13.0–17.0)
Immature Granulocytes: 1 %
Lymphocytes Relative: 5 %
Lymphs Abs: 0.8 K/uL (ref 0.7–4.0)
MCH: 31.5 pg (ref 26.0–34.0)
MCHC: 32.7 g/dL (ref 30.0–36.0)
MCV: 96.2 fL (ref 80.0–100.0)
Monocytes Absolute: 1.2 K/uL — ABNORMAL HIGH (ref 0.1–1.0)
Monocytes Relative: 8 %
Neutro Abs: 12.9 K/uL — ABNORMAL HIGH (ref 1.7–7.7)
Neutrophils Relative %: 86 %
Platelets: 281 K/uL (ref 150–400)
RBC: 5.78 MIL/uL (ref 4.22–5.81)
RDW: 12.2 % (ref 11.5–15.5)
WBC: 15.1 K/uL — ABNORMAL HIGH (ref 4.0–10.5)
nRBC: 0 % (ref 0.0–0.2)

## 2024-06-08 LAB — COMPREHENSIVE METABOLIC PANEL WITH GFR
ALT: 26 U/L (ref 0–44)
AST: 17 U/L (ref 15–41)
Albumin: 5.2 g/dL — ABNORMAL HIGH (ref 3.5–5.0)
Alkaline Phosphatase: 120 U/L (ref 38–126)
Anion gap: 39 — ABNORMAL HIGH (ref 5–15)
BUN: 38 mg/dL — ABNORMAL HIGH (ref 6–20)
CO2: 9 mmol/L — ABNORMAL LOW (ref 22–32)
Calcium: 10.1 mg/dL (ref 8.9–10.3)
Chloride: 91 mmol/L — ABNORMAL LOW (ref 98–111)
Creatinine, Ser: 1.67 mg/dL — ABNORMAL HIGH (ref 0.61–1.24)
GFR, Estimated: 56 mL/min — ABNORMAL LOW (ref >60)
Glucose, Bld: 622 mg/dL (ref 70–99)
Potassium: 4.4 mmol/L (ref 3.5–5.1)
Sodium: 139 mmol/L (ref 135–145)
Total Bilirubin: 0.5 mg/dL (ref 0.0–1.2)
Total Protein: 8.9 g/dL — ABNORMAL HIGH (ref 6.5–8.1)

## 2024-06-08 LAB — BLOOD GAS, VENOUS
Acid-base deficit: 13.6 mmol/L — ABNORMAL HIGH (ref 0.0–2.0)
Bicarbonate: 12.7 mmol/L — ABNORMAL LOW (ref 20.0–28.0)
O2 Saturation: 76.2 %
Patient temperature: 37
pCO2, Ven: 31 mmHg — ABNORMAL LOW (ref 44–60)
pH, Ven: 7.22 — ABNORMAL LOW (ref 7.25–7.43)
pO2, Ven: 47 mmHg — ABNORMAL HIGH (ref 32–45)

## 2024-06-08 LAB — BETA-HYDROXYBUTYRIC ACID
Beta-Hydroxybutyric Acid: >8 mmol/L — ABNORMAL HIGH (ref 0.05–0.27)
Beta-Hydroxybutyric Acid: 6.27 mmol/L — ABNORMAL HIGH (ref 0.05–0.27)
Beta-Hydroxybutyric Acid: 4.23 mmol/L — ABNORMAL HIGH (ref 0.05–0.27)

## 2024-06-08 LAB — CBG MONITORING, ED
Glucose-Capillary: 561 mg/dL (ref 70–99)
Glucose-Capillary: 361 mg/dL — ABNORMAL HIGH (ref 70–99)
Glucose-Capillary: 340 mg/dL — ABNORMAL HIGH (ref 70–99)

## 2024-06-08 LAB — CREATININE, SERUM
Creatinine, Ser: 1.29 mg/dL — ABNORMAL HIGH (ref 0.61–1.24)
GFR, Estimated: >60 mL/min (ref >60)

## 2024-06-08 LAB — OSMOLALITY: Osmolality: 343 mosm/kg (ref 275–295)

## 2024-06-08 LAB — LIPASE, BLOOD: Lipase: 149 U/L — ABNORMAL HIGH (ref 11–51)

## 2024-06-08 LAB — MRSA NEXT GEN BY PCR, NASAL: MRSA by PCR Next Gen: NOT DETECTED

## 2024-06-08 MED ORDER — ACETAMINOPHEN 325 MG PO TABS
650.0000 mg | ORAL_TABLET | Freq: Four times a day (QID) | ORAL | Status: DC | PRN
  Administered 2024-06-08: 650 mg via ORAL
  Filled 2024-06-08: qty 2

## 2024-06-08 MED ORDER — SODIUM CHLORIDE 0.9 % IV SOLN: INTRAVENOUS | Status: DC

## 2024-06-08 MED ORDER — DEXTROSE IN LACTATED RINGERS 5 % IV SOLN: INTRAVENOUS | Status: DC

## 2024-06-08 MED ORDER — SODIUM CHLORIDE (PF) 0.9 % IJ SOLN
INTRAMUSCULAR | Status: AC
Start: 2024-06-08 — End: 2024-06-08
  Filled 2024-06-08: qty 50

## 2024-06-08 MED ORDER — INFLUENZA VIRUS VACC SPLIT PF (FLUZONE) 0.5 ML IM SUSY
0.5000 mL | PREFILLED_SYRINGE | INTRAMUSCULAR | Status: DC
  Filled 2024-06-08: qty 0.5

## 2024-06-08 MED ORDER — LACTATED RINGERS IV BOLUS
1000.0000 mL | INTRAVENOUS | Status: AC
  Administered 2024-06-08: 1000 mL via INTRAVENOUS

## 2024-06-08 MED ORDER — ONDANSETRON HCL 4 MG/2ML IJ SOLN
4.0000 mg | Freq: Once | INTRAMUSCULAR | Status: AC
  Administered 2024-06-08: 4 mg via INTRAVENOUS
  Filled 2024-06-08: qty 2

## 2024-06-08 MED ORDER — CHLORHEXIDINE GLUCONATE CLOTH 2 % EX PADS
6.0000 | MEDICATED_PAD | Freq: Every day | CUTANEOUS | Status: DC
  Administered 2024-06-08 – 2024-06-10 (×2): 6 via TOPICAL

## 2024-06-08 MED ORDER — ONDANSETRON HCL 4 MG/2ML IJ SOLN
4.0000 mg | Freq: Four times a day (QID) | INTRAMUSCULAR | Status: DC | PRN
Start: 2024-06-08 — End: 2024-06-10
  Administered 2024-06-08 – 2024-06-09 (×2): 4 mg via INTRAVENOUS
  Filled 2024-06-08 (×2): qty 2

## 2024-06-08 MED ORDER — ACETAMINOPHEN 650 MG RE SUPP: 650.0000 mg | Freq: Four times a day (QID) | RECTAL | Status: DC | PRN

## 2024-06-08 MED ORDER — PNEUMOCOCCAL 20-VAL CONJ VACC 0.5 ML IM SUSY
0.5000 mL | PREFILLED_SYRINGE | INTRAMUSCULAR | Status: DC
  Filled 2024-06-08: qty 0.5

## 2024-06-08 MED ORDER — HEPARIN SODIUM (PORCINE) 5000 UNIT/ML IJ SOLN
5000.0000 [IU] | Freq: Three times a day (TID) | INTRAMUSCULAR | Status: DC
  Administered 2024-06-08 – 2024-06-10 (×5): 5000 [IU] via SUBCUTANEOUS
  Filled 2024-06-08 (×6): qty 1

## 2024-06-08 MED ORDER — METOCLOPRAMIDE HCL 5 MG/ML IJ SOLN
10.0000 mg | Freq: Four times a day (QID) | INTRAMUSCULAR | Status: DC | PRN
  Administered 2024-06-08 – 2024-06-09 (×3): 10 mg via INTRAVENOUS
  Filled 2024-06-08 (×3): qty 2

## 2024-06-08 MED ORDER — SODIUM CHLORIDE 0.9 % IV BOLUS
1000.0000 mL | Freq: Once | INTRAVENOUS | Status: AC
  Administered 2024-06-08: 1000 mL via INTRAVENOUS

## 2024-06-08 MED ORDER — INSULIN REGULAR(HUMAN) IN NACL 100-0.9 UT/100ML-% IV SOLN
INTRAVENOUS | Status: DC
  Administered 2024-06-08: 7 [IU]/h via INTRAVENOUS
  Filled 2024-06-08 (×2): qty 100

## 2024-06-08 MED ORDER — ALBUTEROL SULFATE (2.5 MG/3ML) 0.083% IN NEBU: 2.5000 mg | INHALATION_SOLUTION | RESPIRATORY_TRACT | Status: DC | PRN

## 2024-06-08 MED ORDER — LACTATED RINGERS IV SOLN: INTRAVENOUS | Status: DC

## 2024-06-08 MED ORDER — IOHEXOL 300 MG/ML  SOLN
100.0000 mL | Freq: Once | INTRAMUSCULAR | Status: AC | PRN
  Administered 2024-06-08: 100 mL via INTRAVENOUS

## 2024-06-08 MED ORDER — DEXTROSE 50 % IV SOLN
0.0000 mL | INTRAVENOUS | Status: DC | PRN
  Filled 2024-06-08: qty 50

## 2024-06-08 MED ORDER — POTASSIUM CHLORIDE 10 MEQ/100ML IV SOLN
10.0000 meq | Freq: Once | INTRAVENOUS | Status: AC
Start: 2024-06-08 — End: 2024-06-08
  Administered 2024-06-08: 10 meq via INTRAVENOUS
  Filled 2024-06-08: qty 100

## 2024-06-08 NOTE — ED Notes (Signed)
 UTO orthostatic vital signs, patient reports he cannot stand.

## 2024-06-08 NOTE — ED Triage Notes (Signed)
 Per EMS patient c/o n/v since Sunday. Denies fever/abd pain/diarrhea/constipation. Patient is alert and oriented x 4. Airway patent, respirations even and unlabored. Skin normal, warm and dry. patient has no complaints at this time. Siderails up x 2. Call light at bedside.

## 2024-06-08 NOTE — H&P (Signed)
 History and Physical  Benjamin Wagner FMW:[id number] DOB: March 21, 1996 DOA: 06/08/2024  PCP: Doreene Maude PARAS, MD   Chief Complaint: Vomiting, increased thirst  HPI: Benjamin Wagner is a 28 y.o. male with medical history significant for seizure disorder and cognitive delay being admitted to the hospital with several days of vomiting, increased thirst found to have diabetic ketoacidosis likely due to new onset diabetes.  History is provided mainly by the patient's mother as well as her close friend who are both at the bedside.  They state that for the last few days, patient has been nauseous and vomiting especially more in the last 24 hours.  They deny any fevers, complaints of pain other than some mild abdominal cramping since he has been throwing up.  No other infectious symptoms, the patient also corroborates this.  For the last few days, he has been very thirsty, mother is unsure if he has been urinating more frequently.  Review of Systems: Please see HPI for pertinent positives and negatives. A complete 10 system review of systems are otherwise negative.  Past Medical History:  Diagnosis Date   Seizures [facility])    Past Surgical History:  Procedure Laterality Date   CIRCUMCISION  1996   Social History:  reports that he has never smoked. He has been exposed to tobacco smoke. He has never used smokeless tobacco. He reports that he does not drink alcohol and does not use drugs.  No Known Allergies  Family history: His mother has diabetes  Prior to Admission medications   Medication Sig Start Date End Date Taking? Authorizing Provider  lamoTRIgine  (LAMICTAL ) 25 MG tablet TAKE 1 TABLET BY MOUTH EVERY MORNING, THEN 2 TABLETS BY MOUTH MIDDAY, AND 3 TABLETS EVERY NIGHT AT BEDTIME 04/27/24   Marianna City, NP   Physical Exam: BP 128/84   Pulse (!) 120   Temp 98.9 F (37.2 C) (Oral)   Resp 16   Wt 59.6 kg   SpO2 98%   BMI 19.69 kg/m  General:  Alert, oriented, calm, in no  acute distress, resting comfortably on room air.  His mother and family friend are at the bedside. Eyes: EOMI, clear conjuctivae, white sclerea Neck: supple, no masses, trachea mildline  Cardiovascular: RRR, no murmurs or rubs, no peripheral edema  Respiratory: clear to auscultation bilaterally, no wheezes, no crackles  Abdomen: soft, nontender, nondistended, normal bowel tones heard  Skin: dry, no rashes  Musculoskeletal: no joint effusions, normal range of motion  Psychiatric: appropriate affect, normal speech  Neurologic: extraocular muscles intact, clear speech, moving all extremities with intact sensorium         Labs on Admission:  Basic Metabolic Panel: Recent Labs  Lab 06/08/24 0926  NA 139  K 4.4  CL 91*  CO2 9*  GLUCOSE 622*  BUN 38*  CREATININE 1.67*  CALCIUM 10.1   Liver Function Tests: Recent Labs  Lab 06/08/24 0926  AST 17  ALT 26  ALKPHOS 120  BILITOT 0.5  PROT 8.9*  ALBUMIN 5.2*   Recent Labs  Lab 06/08/24 0926  LIPASE 149*   No results for input(s): AMMONIA in the last 168 hours. CBC: Recent Labs  Lab 06/08/24 0926  WBC 15.1*  NEUTROABS 12.9*  HGB 18.2*  HCT 55.6*  MCV 96.2  PLT 281   Cardiac Enzymes: No results for input(s): CKTOTAL, CKMB, CKMBINDEX, TROPONINI in the last 168 hours. BNP (last 3 results) No results for input(s): BNP in the last 8760 hours.  ProBNP (last 3 results) No  results for input(s): PROBNP in the last 8760 hours.  CBG: Recent Labs  Lab 06/08/24 1045 06/08/24 1215 06/08/24 1322  GLUCAP 561* 361* 340*    Radiological Exams on Admission: DG Chest Portable 1 View Result Date: 06/08/2024 EXAM: 1 VIEW XRAY OF THE CHEST 06/08/2024 11:17:00 AM COMPARISON: None available. CLINICAL HISTORY: Weakness FINDINGS: LINES, TUBES AND DEVICES: Cardiac leads noted. LUNGS AND PLEURA: No focal pulmonary opacity. No pleural effusion. No pneumothorax. HEART AND MEDIASTINUM: No acute abnormality of the cardiac and  mediastinal silhouettes. BONES AND SOFT TISSUES: No acute osseous abnormality. IMPRESSION: 1. No acute process. Electronically signed by: Evalene Coho MD 06/08/2024 11:39 AM EST RP Workstation: [id number]   Assessment/Plan Benjamin Wagner is a 28 y.o. male with medical history significant for seizure disorder and cognitive delay being admitted to the hospital with several days of vomiting, increased thirst found to have diabetic ketoacidosis likely due to new onset diabetes.   Diabetic ketoacidosis-with anion gap metabolic acidosis and elevated beta hydroxybutyrate.  Patient is awake alert and oriented, nauseous but no longer actively vomiting.  No signs or symptoms of active infection. -Inpatient admission -Monitor closely on stepdown -N.p.o. except ice chips and sips with meds -IV fluids and IV insulin  drip per DKA protocol -Monitor BMP and beta hydroxybutyrate acid every 4 hours -Patient will remain n.p.o. on the above protocol until anion gap is closed x 2  Seizure disorder-continue home Lamictal   Acute kidney injury-patient with presumed baseline normal renal function, currently has AKI likely due to ATN from significant dehydration  DVT prophylaxis: Lovenox     Code Status: Full Code  Consults called: None  Admission status: The appropriate patient status for this patient is INPATIENT. Inpatient status is judged to be reasonable and necessary in order to provide the required intensity of service to ensure the patient's safety. The patient's presenting symptoms, physical exam findings, and initial radiographic and laboratory data in the context of their chronic comorbidities is felt to place them at high risk for further clinical deterioration. Furthermore, it is not anticipated that the patient will be medically stable for discharge from the hospital within 2 midnights of admission.    I certify that at the point of admission it is my clinical judgment that the patient will  require inpatient hospital care spanning beyond 2 midnights from the point of admission due to high intensity of service, high risk for further deterioration and high frequency of surveillance required  Time spent: 59 minutes  Heena Woodbury CHRISTELLA Gail MD [facility] Pager [phone]  If 7PM-7AM, please contact night-coverage [email or web address] Password [email or web address]  06/08/2024, 2:26 PM

## 2024-06-08 NOTE — ED Provider Notes (Addendum)
 [place] EMERGENCY DEPARTMENT AT [facility] Provider Note   CSN: [id number] Arrival date & time: 06/08/24  9091     Patient presents with: Nausea and Emesis   Benjamin Wagner is a 28 y.o. male who was brought in by EMS secondary to increased nausea and vomiting over the last 3 days.  Per his mother who is his primary caretaker he has exhibited nausea vomiting over the last 3 days with increase of the last 24 hours.  States that she has only been giving him water as it is the only thing he has been able to tolerate, however as of this morning was not even able to tolerate water.  He denies having any abdominal pain or cramping, but does state that he feels generally unwell and that he also endorses generalized fatigue.  He does have a history of seizure disorder, takes Lamictal  regularly for this.  Previous diagnosis is of generalized convulsive epilepsy, as well as mild intellectual disability.    Emesis      Prior to Admission medications   Medication Sig Start Date End Date Taking? Authorizing Provider  lamoTRIgine  (LAMICTAL ) 25 MG tablet TAKE 1 TABLET BY MOUTH EVERY MORNING, THEN 2 TABLETS BY MOUTH MIDDAY, AND 3 TABLETS EVERY NIGHT AT BEDTIME 04/27/24   Marianna City, NP    Allergies: Patient has no known allergies.    Review of Systems  Gastrointestinal:  Positive for nausea and vomiting.  All other systems reviewed and are negative.   Updated Vital Signs BP 128/84   Pulse (!) 120   Temp 98.9 F (37.2 C) (Oral)   Resp 16   Wt 59.6 kg   SpO2 98%   BMI 19.69 kg/m   Physical Exam Vitals and nursing note reviewed.  Constitutional:      General: He is not in acute distress.    Appearance: Normal appearance.  HENT:     Head: Normocephalic and atraumatic.     Mouth/Throat:     Mouth: Mucous membranes are dry.     Pharynx: Oropharynx is clear.     Comments: Dry oral mucosa Eyes:     Extraocular Movements: Extraocular movements intact.      Conjunctiva/sclera: Conjunctivae normal.     Pupils: Pupils are equal, round, and reactive to light.  Cardiovascular:     Rate and Rhythm: Normal rate and regular rhythm.     Pulses: Normal pulses.     Heart sounds: Normal heart sounds. No murmur heard.    No friction rub. No gallop.  Pulmonary:     Effort: Pulmonary effort is normal.     Breath sounds: Normal breath sounds.  Abdominal:     General: Abdomen is flat. Bowel sounds are normal.     Palpations: Abdomen is soft.     Tenderness: There is no abdominal tenderness.  Musculoskeletal:        General: Normal range of motion.     Cervical back: Normal range of motion and neck supple.     Right lower leg: No edema.     Left lower leg: No edema.  Skin:    General: Skin is warm and dry.     Capillary Refill: Capillary refill takes less than 2 seconds.     Comments: Normal skin turgor appreciated  Neurological:     General: No focal deficit present.     Mental Status: He is alert. Mental status is at baseline.  Psychiatric:        Mood  and Affect: Mood normal.     (all labs ordered are listed, but only abnormal results are displayed) Labs Reviewed  COMPREHENSIVE METABOLIC PANEL WITH GFR - Abnormal; Notable for the following components:      Result Value   Chloride 91 (*)    CO2 9 (*)    Glucose, Bld 622 (*)    BUN 38 (*)    Creatinine, Ser 1.67 (*)    Total Protein 8.9 (*)    Albumin 5.2 (*)    GFR, Estimated 56 (*)    Anion gap 39 (*)    All other components within normal limits  LIPASE, BLOOD - Abnormal; Notable for the following components:   Lipase 149 (*)    All other components within normal limits  CBC WITH DIFFERENTIAL/PLATELET - Abnormal; Notable for the following components:   WBC 15.1 (*)    Hemoglobin 18.2 (*)    HCT 55.6 (*)    Neutro Abs 12.9 (*)    Monocytes Absolute 1.2 (*)    Abs Immature Granulocytes 0.11 (*)    All other components within normal limits  URINALYSIS, ROUTINE W REFLEX MICROSCOPIC  - Abnormal; Notable for the following components:   Color, Urine STRAW (*)    Glucose, UA >=500 (*)    Hgb urine dipstick SMALL (*)    Ketones, ur 80 (*)    Protein, ur 30 (*)    All other components within normal limits  BETA-HYDROXYBUTYRIC ACID - Abnormal; Notable for the following components:   Beta-Hydroxybutyric Acid >8.00 (*)    All other components within normal limits  CBG MONITORING, ED - Abnormal; Notable for the following components:   Glucose-Capillary 561 (*)    All other components within normal limits  CBG MONITORING, ED - Abnormal; Notable for the following components:   Glucose-Capillary 361 (*)    All other components within normal limits  CBG MONITORING, ED - Abnormal; Notable for the following components:   Glucose-Capillary 340 (*)    All other components within normal limits  OSMOLALITY  BLOOD GAS, VENOUS    EKG: EKG Interpretation Date/Time:  Tuesday June 08 2024 09:31:29 EST Ventricular Rate:  135 PR Interval:  146 QRS Duration:  76 QT Interval:  297 QTC Calculation: 446 R Axis:   107  Text Interpretation: Sinus tachycardia rate related changes. no old comparison Confirmed by Armenta Canning [id number]) on 06/08/2024 9:42:17 AM  Radiology: DG Chest Portable 1 View Result Date: 06/08/2024 EXAM: 1 VIEW XRAY OF THE CHEST 06/08/2024 11:17:00 AM COMPARISON: None available. CLINICAL HISTORY: Weakness FINDINGS: LINES, TUBES AND DEVICES: Cardiac leads noted. LUNGS AND PLEURA: No focal pulmonary opacity. No pleural effusion. No pneumothorax. HEART AND MEDIASTINUM: No acute abnormality of the cardiac and mediastinal silhouettes. BONES AND SOFT TISSUES: No acute osseous abnormality. IMPRESSION: 1. No acute process. Electronically signed by: Evalene Coho MD 06/08/2024 11:39 AM EST RP Workstation: [id number]     .Critical Care  Performed by: Myriam Dorn BROCKS, PA Authorized by: Myriam Dorn BROCKS, PA   Critical care provider statement:    Critical care  time (minutes):  30   Critical care time was exclusive of:  Separately billable procedures and treating other patients   Critical care was necessary to treat or prevent imminent or life-threatening deterioration of the following conditions:  Dehydration and endocrine crisis   Critical care was time spent personally by me on the following activities:  Development of treatment plan with patient or surrogate, discussions with consultants, evaluation of patient's  response to treatment, examination of patient, obtaining history from patient or surrogate, ordering and performing treatments and interventions, ordering and review of laboratory studies, ordering and review of radiographic studies, re-evaluation of patient's condition and review of old charts   Care discussed with: admitting provider      Medications Ordered in the ED  sodium chloride  0.9 % bolus 1,000 mL (0 mLs Intravenous Stopped 06/08/24 1224)    And  0.9 %  sodium chloride  infusion (0 mLs Intravenous Stopped 06/08/24 1215)  insulin  regular, human (MYXREDLIN ) 100 units/ 100 mL infusion (6.5 Units/hr Intravenous Rate/Dose Change 06/08/24 1326)  lactated ringers  infusion ( Intravenous New Bag/Given 06/08/24 1048)  dextrose  50 % solution 0-50 mL (has no administration in time range)  lactated ringers  bolus 1,000 mL ( Intravenous Canceled Entry 06/08/24 1219)  ondansetron  (ZOFRAN ) injection 4 mg (4 mg Intravenous Given 06/08/24 0933)  potassium chloride  10 mEq in 100 mL IVPB (0 mEq Intravenous Stopped 06/08/24 1155)  iohexol  (OMNIPAQUE ) 300 MG/ML solution 100 mL (100 mLs Intravenous Contrast Given 06/08/24 1256)                                    Medical Decision Making Amount and/or Complexity of Data Reviewed Labs: ordered. Radiology: ordered.  Risk Prescription drug management. Decision regarding hospitalization.   Medical Decision Making:   Benjamin Wagner is a 28 y.o. male who presented to the ED today with nausea and  vomiting detailed above.    Additional history discussed with patient's family/caregivers.  External chart has been reviewed including outpatient records as well as outpatient labs and imaging. Patient's presentation is complicated by their history of seizure disorder.  Patient placed on continuous vitals and telemetry monitoring while in ED which was reviewed periodically.  Complete initial physical exam performed, notably the patient  was alert and oriented in no apparent distress however visibly unwell.  He does not have any abdominal tenderness, skin turgor is normal however the oral mucosa very dry.    Reviewed and confirmed nursing documentation for past medical history, family history, social history.    Initial Assessment:   With the patient's presentation of nausea vomiting poor p.o. intake, consider possible medication effect, also consider hyperglycemia, other metabolic abnormality, gastroenteritis. This is most consistent with an acute complicated illness  Initial Plan:  Initiate fluid resuscitation with 1 L bolus of normal saline Screening labs including CBC and Metabolic panel to evaluate for infectious or metabolic etiology of disease.  Urinalysis with reflex culture ordered to evaluate for UTI or relevant urologic/nephrologic pathology.  CXR to evaluate for structural/infectious intrathoracic pathology.  EKG to evaluate for cardiac pathology Objective evaluation as below reviewed   Initial Study Results:   Laboratory  All laboratory results reviewed without evidence of clinically relevant pathology.   Exceptions include: Blood glucose by the BMP is 622.  Creatinine is elevated 1.67 with corresponding decrease in GFR to 56.  Repeat spot glucose is at 340.  Lipase elevated 149.  Urine glucose is greater than 500.  Leukocytosis of 15.1 with neutrophilia of 12.9.  Elevated hemoglobin 18.2 with hematocrit of 55.6, along with creatinine elevation suggests fluid volume  deficit.  EKG EKG was reviewed independently. Rate, rhythm, axis, intervals all examined and without medically relevant abnormality. ST segments without concerns for elevations.    Radiology:  All images reviewed independently. Agree with radiology report at this time.    CT abdomen  pelvis is pending at this time  DG Chest Portable 1 View Result Date: 06/08/2024 EXAM: 1 VIEW XRAY OF THE CHEST 06/08/2024 11:17:00 AM COMPARISON: None available. CLINICAL HISTORY: Weakness FINDINGS: LINES, TUBES AND DEVICES: Cardiac leads noted. LUNGS AND PLEURA: No focal pulmonary opacity. No pleural effusion. No pneumothorax. HEART AND MEDIASTINUM: No acute abnormality of the cardiac and mediastinal silhouettes. BONES AND SOFT TISSUES: No acute osseous abnormality. IMPRESSION: 1. No acute process. Electronically signed by: Evalene Coho MD 06/08/2024 11:39 AM EST RP Workstation: [id number]      Consults: Case discussed with Dr. Zella with the hospitalist team.   Reassessment and Plan:   Based on the elevated blood glucose levels, and lack of history of previous diagnosis of diabetes, serum beta hydroxybutyric acid was obtained.  This is greater than 8.  Urine ketones were 80.  Along with the remainder of the findings suggesting fluid volume deficit, profound hyperglycemia with glycosuria, and ketonuria this suggests diagnosis of DKA without prior history of diabetes.  Began management of hyperglycemia with insulin  management, continued fluid resuscitation, and plan for admission for the same.  Consulted with Dr. Zella with the hospitalist team who agrees to accept this patient for DKA management, continued insulin  administration and fluid resuscitation.  This plan was thoroughly discussed with the patient and his mother who both understand and agree with the need for admission at this time.       Final diagnoses:  Diabetic ketoacidosis without coma associated with diabetes mellitus due to  underlying condition [facility])    ED Discharge Orders     None          Myriam Dorn BROCKS, PA 06/08/24 1348    Ruthe Cornet, DO 06/08/24 1401    Myriam Dorn BROCKS, PA 06/08/24 1454    Ruthe Cornet, DO 06/08/24 1459

## 2024-06-09 ENCOUNTER — Other Ambulatory Visit (HOSPITAL_COMMUNITY): Payer: Self-pay

## 2024-06-09 ENCOUNTER — Telehealth (HOSPITAL_COMMUNITY): Payer: Self-pay

## 2024-06-09 ENCOUNTER — Encounter (HOSPITAL_COMMUNITY): Payer: Self-pay | Admitting: Internal Medicine

## 2024-06-09 DIAGNOSIS — F70 Mild intellectual disabilities: Secondary | ICD-10-CM | POA: Diagnosis not present

## 2024-06-09 DIAGNOSIS — E109 Type 1 diabetes mellitus without complications: Secondary | ICD-10-CM

## 2024-06-09 DIAGNOSIS — N179 Acute kidney failure, unspecified: Secondary | ICD-10-CM

## 2024-06-09 DIAGNOSIS — E119 Type 2 diabetes mellitus without complications: Secondary | ICD-10-CM | POA: Insufficient documentation

## 2024-06-09 DIAGNOSIS — E1010 Type 1 diabetes mellitus with ketoacidosis without coma: Secondary | ICD-10-CM

## 2024-06-09 DIAGNOSIS — G40309 Generalized idiopathic epilepsy and epileptic syndromes, not intractable, without status epilepticus: Secondary | ICD-10-CM | POA: Diagnosis not present

## 2024-06-09 LAB — GLUCOSE, CAPILLARY
Glucose-Capillary: 156 mg/dL — ABNORMAL HIGH (ref 70–99)
Glucose-Capillary: 162 mg/dL — ABNORMAL HIGH (ref 70–99)
Glucose-Capillary: 166 mg/dL — ABNORMAL HIGH (ref 70–99)
Glucose-Capillary: 166 mg/dL — ABNORMAL HIGH (ref 70–99)
Glucose-Capillary: 168 mg/dL — ABNORMAL HIGH (ref 70–99)
Glucose-Capillary: 170 mg/dL — ABNORMAL HIGH (ref 70–99)
Glucose-Capillary: 174 mg/dL — ABNORMAL HIGH (ref 70–99)
Glucose-Capillary: 182 mg/dL — ABNORMAL HIGH (ref 70–99)
Glucose-Capillary: 182 mg/dL — ABNORMAL HIGH (ref 70–99)
Glucose-Capillary: 183 mg/dL — ABNORMAL HIGH (ref 70–99)
Glucose-Capillary: 202 mg/dL — ABNORMAL HIGH (ref 70–99)
Glucose-Capillary: 227 mg/dL — ABNORMAL HIGH (ref 70–99)
Glucose-Capillary: 243 mg/dL — ABNORMAL HIGH (ref 70–99)
Glucose-Capillary: 299 mg/dL — ABNORMAL HIGH (ref 70–99)

## 2024-06-09 LAB — BASIC METABOLIC PANEL WITH GFR
Anion gap: 14 (ref 5–15)
Anion gap: 13 (ref 5–15)
BUN: 18 mg/dL (ref 6–20)
BUN: 16 mg/dL (ref 6–20)
CO2: 22 mmol/L (ref 22–32)
CO2: 23 mmol/L (ref 22–32)
Calcium: 9.4 mg/dL (ref 8.9–10.3)
Calcium: 9.5 mg/dL (ref 8.9–10.3)
Chloride: 113 mmol/L — ABNORMAL HIGH (ref 98–111)
Chloride: 113 mmol/L — ABNORMAL HIGH (ref 98–111)
Creatinine, Ser: 1.01 mg/dL (ref 0.61–1.24)
Creatinine, Ser: 0.96 mg/dL (ref 0.61–1.24)
GFR, Estimated: >60 mL/min (ref >60)
GFR, Estimated: >60 mL/min (ref >60)
Glucose, Bld: 159 mg/dL — ABNORMAL HIGH (ref 70–99)
Glucose, Bld: 152 mg/dL — ABNORMAL HIGH (ref 70–99)
Potassium: 4 mmol/L (ref 3.5–5.1)
Potassium: 4.1 mmol/L (ref 3.5–5.1)
Sodium: 149 mmol/L — ABNORMAL HIGH (ref 135–145)
Sodium: 149 mmol/L — ABNORMAL HIGH (ref 135–145)

## 2024-06-09 LAB — CBC
HCT: 49.4 % (ref 39.0–52.0)
Hemoglobin: 16.7 g/dL (ref 13.0–17.0)
MCH: 31.7 pg (ref 26.0–34.0)
MCHC: 33.8 g/dL (ref 30.0–36.0)
MCV: 93.7 fL (ref 80.0–100.0)
Platelets: 224 K/uL (ref 150–400)
RBC: 5.27 MIL/uL (ref 4.22–5.81)
RDW: 12.4 % (ref 11.5–15.5)
WBC: 11.4 K/uL — ABNORMAL HIGH (ref 4.0–10.5)
nRBC: 0 % (ref 0.0–0.2)

## 2024-06-09 LAB — HEMOGLOBIN A1C
Hgb A1c MFr Bld: >15.5 % — ABNORMAL HIGH (ref 4.8–5.6)
Mean Plasma Glucose: >398 mg/dL

## 2024-06-09 LAB — BETA-HYDROXYBUTYRIC ACID
Beta-Hydroxybutyric Acid: 2.9 mmol/L — ABNORMAL HIGH (ref 0.05–0.27)
Beta-Hydroxybutyric Acid: 2.04 mmol/L — ABNORMAL HIGH (ref 0.05–0.27)
Beta-Hydroxybutyric Acid: 2.25 mmol/L — ABNORMAL HIGH (ref 0.05–0.27)

## 2024-06-09 LAB — HIV ANTIBODY (ROUTINE TESTING W REFLEX): HIV Screen 4th Generation wRfx: NONREACTIVE

## 2024-06-09 MED ORDER — INSULIN ASPART 100 UNIT/ML IJ SOLN
0.0000 [IU] | Freq: Three times a day (TID) | INTRAMUSCULAR | Status: DC
  Administered 2024-06-09: 3 [IU] via SUBCUTANEOUS
  Administered 2024-06-09 – 2024-06-10 (×2): 8 [IU] via SUBCUTANEOUS
  Administered 2024-06-10: 3 [IU] via SUBCUTANEOUS
  Filled 2024-06-09 (×2): qty 8
  Filled 2024-06-09 (×2): qty 3

## 2024-06-09 MED ORDER — LAMOTRIGINE 25 MG PO TABS
50.0000 mg | ORAL_TABLET | Freq: Every day | ORAL | Status: DC
  Administered 2024-06-09 – 2024-06-10 (×2): 50 mg via ORAL
  Filled 2024-06-09 (×2): qty 2

## 2024-06-09 MED ORDER — INSULIN STARTER KIT- PEN NEEDLES (ENGLISH)
1.0000 | Freq: Once | Status: DC
Start: 2024-06-09 — End: 2024-06-10
  Filled 2024-06-09: qty 1

## 2024-06-09 MED ORDER — LAMOTRIGINE 25 MG PO TABS
75.0000 mg | ORAL_TABLET | Freq: Every day | ORAL | Status: DC
  Administered 2024-06-09: 75 mg via ORAL
  Filled 2024-06-09: qty 3

## 2024-06-09 MED ORDER — LIVING WELL WITH DIABETES BOOK
Freq: Once | Status: DC
Start: 2024-06-09 — End: 2024-06-10
  Filled 2024-06-09: qty 1

## 2024-06-09 MED ORDER — INSULIN ASPART 100 UNIT/ML IJ SOLN
0.0000 [IU] | Freq: Every day | INTRAMUSCULAR | Status: DC
  Administered 2024-06-09: 2 [IU] via SUBCUTANEOUS
  Filled 2024-06-09: qty 5

## 2024-06-09 MED ORDER — SODIUM CHLORIDE 0.45 % IV SOLN: INTRAVENOUS | Status: AC

## 2024-06-09 MED ORDER — INSULIN GLARGINE-YFGN 100 UNIT/ML ~~LOC~~ SOLN
10.0000 [IU] | Freq: Every day | SUBCUTANEOUS | Status: DC
  Administered 2024-06-09 – 2024-06-10 (×2): 10 [IU] via SUBCUTANEOUS
  Filled 2024-06-09 (×2): qty 0.1

## 2024-06-09 MED ORDER — LAMOTRIGINE 25 MG PO TABS
25.0000 mg | ORAL_TABLET | Freq: Every day | ORAL | Status: DC
  Administered 2024-06-09 – 2024-06-10 (×2): 25 mg via ORAL
  Filled 2024-06-09 (×2): qty 1

## 2024-06-09 NOTE — Inpatient Diabetes Management (Signed)
 Inpatient Diabetes Program Recommendations  AACE/ADA: New Consensus Statement on Inpatient Glycemic Control (2015)  Target Ranges:  Prepandial:   less than 140 mg/dL      Peak postprandial:   less than 180 mg/dL (1-2 hours)      Critically ill patients:  140 - 180 mg/dL   Lab Results  Component Value Date   GLUCAP 168 (H) 06/09/2024   Diabetes history: New Onset Current orders for Inpatient glycemic control: Semglee  10 units daily Novolog  0-15 units tid, 0-5 units hs  Inpatient Diabetes Program Recommendations:   Spoke with pt's mom about new diagnosis. Patient spoke on introduction but gagging while in the room. Discussed A1C results with them and explained what an A1C is, basic pathophysiology of DM Type 2, basic home care, basic diabetes diet nutrition principles, importance of checking CBGs and maintaining good CBG control to prevent long-term and short-term complications. Reviewed signs and symptoms of hyperglycemia and hypoglycemia and how to treat hypoglycemia at home. Also reviewed blood sugar goals at home.  RNs to provide ongoing basic DM education at bedside with this patient. Have ordered educational booklet, insulin  starter kit.  Patient's mom is currently on insulin  pens and will be giving patient his insulin . Reviewed insulin  pen steps with mom. Mom does not change to a new needle nor flushes needle each time, so reviewed why each step is needed but mom verbalized a new needle is a waste.  Educated patient's mom on insulin  pen use at home. Reviewed contents of insulin  flexpen starter kit. Reviewed all steps of insulin  pen including attachment of needle, 2-unit air shot, dialing up dose, giving injection, removing needle, disposal of sharps, storage of unused insulin , disposal of insulin  etc.  Reviewed with mom about CGM. Patient may choose to use CGM in the future.  Thank you, Ferdie Bakken E. Courage Biglow, RN, MSN, CNS, CDCES  Diabetes Coordinator Inpatient Glycemic Control Team Team Pager  [phone] (8am-5pm) 06/09/2024 12:09 PM

## 2024-06-09 NOTE — Progress Notes (Addendum)
 Progress Note   Patient: Benjamin Wagner FMW:[id number] DOB: 01/26/1996 DOA: 06/08/2024  DOS: the patient was seen and examined on 06/09/2024   Brief hospital course:  53M with medical history of seizure disorder and cognitive delay being admitted to the hospital with several days of vomiting, increased thirst found to have diabetic ketoacidosis likely due to new onset diabetes.    Assessment and Plan:  Diabetic ketoacidosis - Marked hyperglycemia and acidemia on presentation.  Initiated on insulin  sliding scale/DKA protocol.  Showing marked improvement in glucose and acidemia.  Acidemia resolved.  Will transition patient to subcu insulin  regiment.  New onset diabetes mellitus - No history of diabetes in the past.  Presenting with DKA.  DKA resolved, transitioning to insulin  glargine 10 units with insulin  sliding scale.  Monitor glucose and recheck BMP later this afternoon.  Acute kidney injury - Likely prerenal etiology given above.  Creatinine 1.67 on presentation, now improved to 0.96.  IV fluids on board.  Monitor urine output recheck BMP in AM.  Epilepsy - Resume patient's oral AEM.    Subjective: Patient resting on his side, states he feels improved.  Still having some nausea this morning.  Denies any fever, shortness of breath, chest pain, abdominal pain.  Physical Exam:  Vitals:   06/09/24 0800 06/09/24 0815 06/09/24 0900 06/09/24 1000  BP: (!) 122/91   121/78  Pulse: (!) 101  (!) 106 (!) 103  Resp: 13  13 12   Temp:  98.9 F (37.2 C)    TempSrc:  Oral    SpO2: 96%  100% 100%  Weight:      Height:        GENERAL:  Alert, pleasant, mild acute distress  HEENT:  EOMI CARDIOVASCULAR:  RRR, no murmurs appreciated RESPIRATORY:  Clear to auscultation, no wheezing, rales, or rhonchi GASTROINTESTINAL:  Soft, nontender, nondistended EXTREMITIES:  No LE edema bilaterally NEURO:  No new focal deficits appreciated SKIN:  No rashes noted PSYCH:  Appropriate mood and  affect     Data Reviewed:  Imaging Studies: CT ABDOMEN PELVIS W CONTRAST Result Date: 06/08/2024 CLINICAL DATA:  Abdominal pain, acute, nonlocalized Nausea and vomiting for 2 days. EXAM: CT ABDOMEN AND PELVIS WITH CONTRAST TECHNIQUE: Multidetector CT imaging of the abdomen and pelvis was performed using the standard protocol following bolus administration of intravenous contrast. RADIATION DOSE REDUCTION: This exam was performed according to the departmental dose-optimization program which includes automated exposure control, adjustment of the mA and/or kV according to patient size and/or use of iterative reconstruction technique. CONTRAST:  OMNIPAQUE  IOHEXOL  300 MG/ML  SOLN COMPARISON:  None Available. FINDINGS: Technical note: Despite efforts by the technologist and patient, mild motion artifact is present on today's exam and could not be eliminated. This reduces exam sensitivity and specificity. Lower chest: Clear lung bases. No significant pleural or pericardial effusion. Mild distal esophageal wall thickening with a possible small hiatal hernia. Hepatobiliary: The liver has a non cirrhotic morphology without suspicious focal abnormality. No evidence of gallstones, gallbladder wall thickening or biliary dilatation. Pancreas: Unremarkable. No pancreatic ductal dilatation or surrounding inflammatory changes. Spleen: Normal in size without focal abnormality. Adrenals/Urinary Tract: Both adrenal glands appear normal. Renal assessment limited by breathing artifact. Probable early caliceal excretion bilaterally without definite urinary tract calculus. No evidence of renal mass, hydronephrosis or perinephric soft tissue stranding. Possible mild renal cortical scarring bilaterally. The urinary bladder is mildly distended without wall thickening or surrounding inflammation. Stomach/Bowel: No enteric contrast administered. The stomach appears unremarkable for its  degree of distension. No evidence of bowel  wall thickening, distention or surrounding inflammatory change. The appendix is tentatively identified superior to the bladder dome and appears normal. Vascular/Lymphatic: There are no enlarged abdominal or pelvic lymph nodes. No significant vascular findings. Reproductive: The prostate gland and seminal vesicles appear unremarkable. Other: No evidence of abdominal wall mass or hernia. No ascites or pneumoperitoneum. Musculoskeletal: No acute or significant osseous findings. IMPRESSION: 1. No acute findings or explanation for the patient's symptoms. 2. Mild distal esophageal wall thickening with possible small hiatal hernia. Correlate clinically for esophagitis. 3. Possible mild renal scarring bilaterally. Electronically Signed   By: Elsie Perone M.D.   On: 06/08/2024 14:50   DG Chest Portable 1 View Result Date: 06/08/2024 EXAM: 1 VIEW XRAY OF THE CHEST 06/08/2024 11:17:00 AM COMPARISON: None available. CLINICAL HISTORY: Weakness FINDINGS: LINES, TUBES AND DEVICES: Cardiac leads noted. LUNGS AND PLEURA: No focal pulmonary opacity. No pleural effusion. No pneumothorax. HEART AND MEDIASTINUM: No acute abnormality of the cardiac and mediastinal silhouettes. BONES AND SOFT TISSUES: No acute osseous abnormality. IMPRESSION: 1. No acute process. Electronically signed by: Evalene Coho MD 06/08/2024 11:39 AM EST RP Workstation: [id number]    There are no new results to review at this time.  Previous records (including but not limited to H&P, progress notes, nursing notes, TOC management) were reviewed in assessment of this patient.  Labs: CBC: Recent Labs  Lab 06/08/24 0926 06/08/24 1503 06/09/24 0309  WBC 15.1* 16.2* 11.4*  NEUTROABS 12.9*  --   --   HGB 18.2* 17.1* 16.7  HCT 55.6* 51.3 49.4  MCV 96.2 95.4 93.7  PLT 281 232 224   Basic Metabolic Panel: Recent Labs  Lab 06/08/24 1502 06/08/24 1503 06/08/24 1835 06/08/24 2308 06/09/24 0309 06/09/24 0741  NA 145  --  147* 150* 149*  149*  K 4.1  --  4.5 4.2 4.0 4.1  CL 109  --  110 114* 113* 113*  CO2 11*  --  18* 22 22 23   GLUCOSE 259*  --  226* 164* 159* 152*  BUN 27*  --  23* 20 18 16   CREATININE 1.28* 1.29* 1.24 1.12 1.01 0.96  CALCIUM 9.1  --  9.2 9.4 9.4 9.5   Liver Function Tests: Recent Labs  Lab 06/08/24 0926  AST 17  ALT 26  ALKPHOS 120  BILITOT 0.5  PROT 8.9*  ALBUMIN 5.2*   CBG: Recent Labs  Lab 06/09/24 0526 06/09/24 0610 06/09/24 0718 06/09/24 0834 06/09/24 0935  GLUCAP 182* 227* 170* 166* 183*    Scheduled Meds:  Chlorhexidine  Gluconate Cloth  6 each Topical Daily   heparin   5,000 Units Subcutaneous Q8H   influenza vac split trivalent PF  0.5 mL Intramuscular Tomorrow-1000   insulin  aspart  0-15 Units Subcutaneous TID WC   insulin  aspart  0-5 Units Subcutaneous QHS   insulin  glargine-yfgn  10 Units Subcutaneous Daily   insulin  starter kit- pen needles  1 kit Other Once   lamoTRIgine   25 mg Oral Daily   And   lamoTRIgine   50 mg Oral Daily   And   lamoTRIgine   75 mg Oral QHS   living well with diabetes book   Does not apply Once   pneumococcal 20-valent conjugate vaccine  0.5 mL Intramuscular Tomorrow-1000   Continuous Infusions:  sodium chloride      PRN Meds:.acetaminophen  **OR** acetaminophen , albuterol , dextrose , metoCLOPramide  (REGLAN ) injection, ondansetron  (ZOFRAN ) IV  Family Communication: In room  Disposition: Status is: Inpatient Remains inpatient appropriate  because: DKA     Time spent: 40 minutes  Length of inpatient stay: 1 days  Author: Carliss LELON Canales, DO 06/09/2024 10:45 AM  For on call review [email or web address].

## 2024-06-09 NOTE — Plan of Care (Signed)
 Nutrition Education Note  RD consulted for nutrition education regarding diabetes.   Lab Results  Component Value Date   HGBA1C >15.5 (H) 06/08/2024   Patient sleeping at time of visit. Mom at bedside. She reports she is unsure of a UBW for the patient but does not feel he has had any significant changes in weight.  She reports he typically eats around 3 meals a day at home.  Has some eggs or cereal for breakfast, a bologna sandwich for lunch, and a home cooked meal from Mom for dinner. She reports he doesn't really drink anything other than water and that they don't keep sweets in the house.  RD provided Carbohydrate Counting for People with Diabetes and Plate Method for Diabetes handouts from the Academy of Nutrition and Dietetics. Discussed different food groups and their effects on blood sugar, emphasizing carbohydrate-containing foods. Provided list of carbohydrates and recommended serving sizes of common foods.  Discussed importance of controlled and consistent carbohydrate intake throughout the day. Provided examples of ways to balance meals/snacks and encouraged intake of high-fiber, whole grain complex carbohydrates. Teach back method used.  Expect fair compliance. Mom interested in outpatient dietitian referral. This has been ordered.  Body mass index is 17.84 kg/m. Pt meets criteria for underweight based on current BMI.  Current diet order is Carb Modified, no % intake of meals documented yet at this time. Labs and medications reviewed. No further nutrition interventions warranted at this time. If additional nutrition issues arise, please re-consult RD.  Trude Ned RD, LDN Contact via [employer].

## 2024-06-09 NOTE — Plan of Care (Signed)

## 2024-06-09 NOTE — Telephone Encounter (Signed)
 Pharmacy Patient Advocate Encounter  Insurance verification completed.    The patient is insured through [place].     Ran test claim for Lantus  100unit Pen and the current 30 day co-pay is $4.  Ran test claim for Novolog  100unit Pen and the current 30 day co-pay is $4.   This test claim was processed through [employer]- copay amounts may vary at other pharmacies due to [employer], or as the patient moves through the different stages of their insurance plan.

## 2024-06-09 NOTE — Hospital Course (Signed)
 35M with medical history of seizure disorder and cognitive delay being admitted to the hospital with several days of vomiting, increased thirst found to have diabetic ketoacidosis likely due to new onset diabetes.    Assessment and Plan:  Diabetic ketoacidosis - Marked hyperglycemia and acidemia on presentation.  Initiated on insulin  sliding scale/DKA protocol.  Showing marked improvement in glucose and acidemia.  Acidemia resolved.  Will transition patient to subcu insulin  regiment.  New onset diabetes mellitus - No history of diabetes in the past.  Presenting with DKA.  DKA resolved, transitioning to insulin  glargine 10 units with insulin  sliding scale.  Monitor glucose and recheck BMP later this afternoon.  Acute kidney injury - Likely prerenal etiology given above.  Creatinine 1.67 on presentation, now improved to 0.96.  IV fluids on board.  Monitor urine output recheck BMP in AM.  Epilepsy - Resume patient's oral AEM.

## 2024-06-10 DIAGNOSIS — E1010 Type 1 diabetes mellitus with ketoacidosis without coma: Secondary | ICD-10-CM | POA: Diagnosis not present

## 2024-06-10 DIAGNOSIS — N179 Acute kidney failure, unspecified: Secondary | ICD-10-CM | POA: Diagnosis not present

## 2024-06-10 DIAGNOSIS — G40309 Generalized idiopathic epilepsy and epileptic syndromes, not intractable, without status epilepticus: Secondary | ICD-10-CM | POA: Diagnosis not present

## 2024-06-10 DIAGNOSIS — F70 Mild intellectual disabilities: Secondary | ICD-10-CM | POA: Diagnosis not present

## 2024-06-10 LAB — CBC
HCT: 43 % (ref 39.0–52.0)
Hemoglobin: 14.3 g/dL (ref 13.0–17.0)
MCH: 31.8 pg (ref 26.0–34.0)
MCHC: 33.3 g/dL (ref 30.0–36.0)
MCV: 95.8 fL (ref 80.0–100.0)
Platelets: 155 K/uL (ref 150–400)
RBC: 4.49 MIL/uL (ref 4.22–5.81)
RDW: 12.2 % (ref 11.5–15.5)
WBC: 8.6 K/uL (ref 4.0–10.5)
nRBC: 0 % (ref 0.0–0.2)

## 2024-06-10 LAB — MAGNESIUM: Magnesium: 2.1 mg/dL (ref 1.7–2.4)

## 2024-06-10 LAB — GLUCOSE, CAPILLARY
Glucose-Capillary: 264 mg/dL — ABNORMAL HIGH (ref 70–99)
Glucose-Capillary: 183 mg/dL — ABNORMAL HIGH (ref 70–99)

## 2024-06-10 LAB — BASIC METABOLIC PANEL WITH GFR
Anion gap: 13 (ref 5–15)
BUN: 13 mg/dL (ref 6–20)
CO2: 26 mmol/L (ref 22–32)
Calcium: 8.7 mg/dL — ABNORMAL LOW (ref 8.9–10.3)
Chloride: 102 mmol/L (ref 98–111)
Creatinine, Ser: 0.72 mg/dL (ref 0.61–1.24)
GFR, Estimated: >60 mL/min (ref >60)
Glucose, Bld: 224 mg/dL — ABNORMAL HIGH (ref 70–99)
Potassium: 3.8 mmol/L (ref 3.5–5.1)
Sodium: 141 mmol/L (ref 135–145)

## 2024-06-10 MED ORDER — INSULIN ASPART 100 UNIT/ML FLEXPEN: 0.0000 [IU] | PEN_INJECTOR | Freq: Three times a day (TID) | SUBCUTANEOUS | 0 refills | Status: AC

## 2024-06-10 MED ORDER — BAQSIMI TWO PACK 3 MG/DOSE NA POWD: 3.0000 mg | NASAL | 0 refills | Status: AC | PRN

## 2024-06-10 MED ORDER — INSULIN GLARGINE 100 UNIT/ML SOLOSTAR PEN: 10.0000 [IU] | PEN_INJECTOR | Freq: Every day | SUBCUTANEOUS | 0 refills | Status: AC

## 2024-06-10 MED ORDER — LANCETS MISC: 1.0000 | 0 refills | Status: AC

## 2024-06-10 MED ORDER — PEN NEEDLES 31G X 5 MM MISC: 1.0000 | 0 refills | Status: AC

## 2024-06-10 MED ORDER — LANCET DEVICE MISC: 1.0000 | 0 refills | Status: AC

## 2024-06-10 MED ORDER — BLOOD GLUCOSE TEST VI STRP: 1.0000 | ORAL_STRIP | 0 refills | Status: AC

## 2024-06-10 MED ORDER — BLOOD GLUCOSE MONITORING SUPPL DEVI: 1.0000 | 0 refills | Status: AC

## 2024-06-10 NOTE — Plan of Care (Signed)

## 2024-06-10 NOTE — Progress Notes (Signed)
 Mobility Specialist - Progress Note   06/10/24 1215  Mobility  Activity Ambulated with assistance  Level of Assistance Standby assist, set-up cues, supervision of patient - no hands on  Assistive Device None  Distance Ambulated (ft) 200 ft  Range of Motion/Exercises Active  Activity Response Tolerated well  Mobility visit 1 Mobility  Mobility Specialist Start Time (ACUTE ONLY) 1200  Mobility Specialist Stop Time (ACUTE ONLY) 1215  Mobility Specialist Time Calculation (min) (ACUTE ONLY) 15 min   Pt was found in bed and agreeable to mobilize. Stated feeling a little weak. Pt able to progress from HHA to SB towards EOS. At EOS returned to bed with all needs met. Call bell in reach and mother in room. RN notified of session.   Erminio Leos,  Mobility Specialist Can be reached via Secure Chat

## 2024-06-10 NOTE — Discharge Summary (Signed)
 Physician Discharge Summary   Patient: Benjamin Wagner MRN: [id number] DOB: [birth year]  Admit date:     06/08/2024  Discharge date: 06/10/24  Discharge Physician: Carliss LELON Canales   PCP: Doreene Maude PARAS, MD   Recommendations at discharge:    Pt to be discharged home.   If you experience worsening fever, chills, chest pain, shortness of breath, or other concerning symptoms, please call your PCP or go to the emergency department immediately.  Discharge Diagnoses: Principal Problem:   DKA (diabetic ketoacidosis) (HCC) Active Problems:   Mild intellectual disability   Epilepsy, generalized, convulsive (HCC)   AKI (acute kidney injury)   Newly diagnosed type 1 diabetes mellitus (HCC)  Resolved Problems:   * No resolved hospital problems. [facility] Course:  27M with medical history of seizure disorder and cognitive delay being admitted to the hospital with several days of vomiting, increased thirst found to have diabetic ketoacidosis likely due to new onset diabetes.    Assessment and Plan:  Diabetic ketoacidosis - Marked hyperglycemia and acidemia on presentation.  Initiated on insulin  sliding scale/DKA protocol.  Showing marked improvement in glucose and acidemia.  Acidemia resolved.  Will transition patient to subcu insulin  regiment.  New onset diabetes mellitus - No history of diabetes in the past.  Presenting with DKA.  A1c greater than 15.5.  DKA resolved, transitioning to insulin  glargine 10 units with insulin  sliding scale.  Glucose stable.  Patient met with diabetes counselor and patient and family had education about insulin  management.  Informed patient about long-term consequences of uncontrolled diabetes including advanced kidney disease, blindness, neuropathy, and increased cardiovascular risk of stroke and heart attack.   Recommend yearly foot/eye/urine protein exams.  Acute kidney injury - Likely prerenal etiology given above.  Creatinine 1.67 on presentation,  now resolved after IV fluid hydration.  Epilepsy - Resume patient's oral AEM.   Consultants: None Procedures performed: None Disposition: Home Diet recommendation:  Discharge Diet Orders (From admission, onward)     Start     Ordered   06/10/24 0000  Diet Carb Modified        06/10/24 1006           Carb modified diet  DISCHARGE MEDICATION: Allergies as of 06/10/2024   No Known Allergies      Medication List     TAKE these medications    Baqsimi  Two Pack 3 MG/DOSE Powd Generic drug: Glucagon  Place 3 mg into the nose as needed for up to 2 doses (Severe low blood sugar). Give 3 mg (one actuation) into a single nostril.   Blood Glucose Monitoring Suppl Devi 1 each by Does not apply route as directed. Dispense based on patient and insurance preference. Use up to four times daily as directed. (FOR ICD-10 E10.9, E11.9).   BLOOD GLUCOSE TEST STRIPS Strp 1 each by Does not apply route as directed. Dispense based on patient and insurance preference. Use up to four times daily as directed. (FOR ICD-10 E10.9, E11.9).   insulin  aspart 100 UNIT/ML FlexPen Commonly known as: NOVOLOG  Inject 0-6 Units into the skin 3 (three) times daily with meals. Check Blood Glucose (BG) and inject per scale: BG <150= 0 unit; BG 150-200= 1 unit; BG 201-250= 2 unit; BG 251-300= 3 unit; BG 301-350= 4 unit; BG 351-400= 5 unit; BG >400= 6 unit and Call Primary Care.   insulin  glargine 100 UNIT/ML Solostar Pen Commonly known as: LANTUS  Inject 10 Units into the skin daily. May substitute as needed per  insurance.   lamoTRIgine  25 MG tablet Commonly known as: LAMICTAL  TAKE 1 TABLET BY MOUTH EVERY MORNING, THEN 2 TABLETS BY MOUTH MIDDAY, AND 3 TABLETS EVERY NIGHT AT BEDTIME   Lancet Device Misc 1 each by Does not apply route as directed. Dispense based on patient and insurance preference. Use up to four times daily as directed. (FOR ICD-10 E10.9, E11.9).   Lancets Misc 1 each by Does not apply  route as directed. Dispense based on patient and insurance preference. Use up to four times daily as directed. (FOR ICD-10 E10.9, E11.9).   Pen Needles 31G X 5 MM Misc 1 each by Does not apply route as directed. Dispense based on patient and insurance preference. Use up to four times daily as directed. (FOR ICD-10 E10.9, E11.9).         Discharge Exam: Filed Weights   06/08/24 1059 06/08/24 1101 06/08/24 1429  Weight: 59.5 kg 59.6 kg 56.4 kg    GENERAL:  Alert, pleasant, pleasant HEENT:  EOMI CARDIOVASCULAR:  RRR, no murmurs appreciated RESPIRATORY:  Clear to auscultation, no wheezing, rales, or rhonchi GASTROINTESTINAL:  Soft, nontender, nondistended EXTREMITIES:  No LE edema bilaterally NEURO:  No new focal deficits appreciated SKIN:  No rashes noted PSYCH:  Appropriate mood and affect    Condition at discharge: improving  The results of significant diagnostics from this hospitalization (including imaging, microbiology, ancillary and laboratory) are listed below for reference.   Imaging Studies: CT ABDOMEN PELVIS W CONTRAST Result Date: 06/08/2024 CLINICAL DATA:  Abdominal pain, acute, nonlocalized Nausea and vomiting for 2 days. EXAM: CT ABDOMEN AND PELVIS WITH CONTRAST TECHNIQUE: Multidetector CT imaging of the abdomen and pelvis was performed using the standard protocol following bolus administration of intravenous contrast. RADIATION DOSE REDUCTION: This exam was performed according to the departmental dose-optimization program which includes automated exposure control, adjustment of the mA and/or kV according to patient size and/or use of iterative reconstruction technique. CONTRAST:  OMNIPAQUE  IOHEXOL  300 MG/ML  SOLN COMPARISON:  None Available. FINDINGS: Technical note: Despite efforts by the technologist and patient, mild motion artifact is present on today's exam and could not be eliminated. This reduces exam sensitivity and specificity. Lower chest: Clear lung  bases. No significant pleural or pericardial effusion. Mild distal esophageal wall thickening with a possible small hiatal hernia. Hepatobiliary: The liver has a non cirrhotic morphology without suspicious focal abnormality. No evidence of gallstones, gallbladder wall thickening or biliary dilatation. Pancreas: Unremarkable. No pancreatic ductal dilatation or surrounding inflammatory changes. Spleen: Normal in size without focal abnormality. Adrenals/Urinary Tract: Both adrenal glands appear normal. Renal assessment limited by breathing artifact. Probable early caliceal excretion bilaterally without definite urinary tract calculus. No evidence of renal mass, hydronephrosis or perinephric soft tissue stranding. Possible mild renal cortical scarring bilaterally. The urinary bladder is mildly distended without wall thickening or surrounding inflammation. Stomach/Bowel: No enteric contrast administered. The stomach appears unremarkable for its degree of distension. No evidence of bowel wall thickening, distention or surrounding inflammatory change. The appendix is tentatively identified superior to the bladder dome and appears normal. Vascular/Lymphatic: There are no enlarged abdominal or pelvic lymph nodes. No significant vascular findings. Reproductive: The prostate gland and seminal vesicles appear unremarkable. Other: No evidence of abdominal wall mass or hernia. No ascites or pneumoperitoneum. Musculoskeletal: No acute or significant osseous findings. IMPRESSION: 1. No acute findings or explanation for the patient's symptoms. 2. Mild distal esophageal wall thickening with possible small hiatal hernia. Correlate clinically for esophagitis. 3. Possible mild renal scarring  bilaterally. Electronically Signed   By: Elsie Perone M.D.   On: 06/08/2024 14:50   DG Chest Portable 1 View Result Date: 06/08/2024 EXAM: 1 VIEW XRAY OF THE CHEST 06/08/2024 11:17:00 AM COMPARISON: None available. CLINICAL HISTORY: Weakness  FINDINGS: LINES, TUBES AND DEVICES: Cardiac leads noted. LUNGS AND PLEURA: No focal pulmonary opacity. No pleural effusion. No pneumothorax. HEART AND MEDIASTINUM: No acute abnormality of the cardiac and mediastinal silhouettes. BONES AND SOFT TISSUES: No acute osseous abnormality. IMPRESSION: 1. No acute process. Electronically signed by: Evalene Coho MD 06/08/2024 11:39 AM EST RP Workstation: [id number]    Microbiology: Results for orders placed or performed during the hospital encounter of 06/08/24  MRSA Next Gen by PCR, Nasal     Status: None   Collection Time: 06/08/24  2:41 PM   Specimen: Nasal Mucosa; Nasal Swab  Result Value Ref Range Status   MRSA by PCR Next Gen NOT DETECTED NOT DETECTED Final    Comment: (NOTE) The GeneXpert MRSA Assay (FDA approved for NASAL specimens only), is one component of a comprehensive MRSA colonization surveillance program. It is not intended to diagnose MRSA infection nor to guide or monitor treatment for MRSA infections. Test performance is not FDA approved in patients less than 9 years old. Performed at [facility], [address] [place], [place] [address]     Labs: CBC: Recent Labs  Lab 06/08/24 [phone] 06/08/24 1503 06/09/24 0309 06/10/24 0427  WBC 15.1* 16.2* 11.4* 8.6  NEUTROABS 12.9*  --   --   --   HGB 18.2* 17.1* 16.7 14.3  HCT 55.6* 51.3 49.4 43.0  MCV 96.2 95.4 93.7 95.8  PLT 281 232 224 155   Basic Metabolic Panel: Recent Labs  Lab 06/08/24 1835 06/08/24 2308 06/09/24 0309 06/09/24 0741 06/10/24 0427  NA 147* 150* 149* 149* 141  K 4.5 4.2 4.0 4.1 3.8  CL 110 114* 113* 113* 102  CO2 18* 22 22 23 26   GLUCOSE 226* 164* 159* 152* 224*  BUN 23* 20 18 16 13   CREATININE 1.24 1.12 1.01 0.96 0.72  CALCIUM 9.2 9.4 9.4 9.5 8.7*  MG  --   --   --   --  2.1   Liver Function Tests: Recent Labs  Lab 06/08/24 0926  AST 17  ALT 26  ALKPHOS 120  BILITOT 0.5  PROT 8.9*  ALBUMIN 5.2*   CBG: Recent  Labs  Lab 06/09/24 1051 06/09/24 1149 06/09/24 1704 06/09/24 2133 06/10/24 0756  GLUCAP 156* 168* 299* 243* 264*    Discharge time spent: 31 minutes.  Length of inpatient stay: 2 days  Signed: Carliss LELON Canales, DO [facility] 06/10/2024

## 2024-06-10 NOTE — TOC Transition Note (Signed)
 Transition of Care [facility]) - Discharge Note   Patient Details  Name: Benjamin Wagner MRN: [id number] Date of Birth: 03-29-1996  [facility] CM/SW Contact:  Alfonse JONELLE Rex, RN Phone Number: 06/10/2024, 3:15 PM   Clinical Narrative:  DC to home order, met with patient and his mother at bedside, mother states she has no transportation home for her or the patient, states. Taxi voucher provided for patient, Therapist, Nutritional signed by patient's mother. No further INPT CM needs identified at this time.        Barriers to Discharge: Barriers Resolved   Patient Goals and CMS Choice            Discharge Placement                       Discharge Plan and Services Additional resources added to the After Visit Summary for                                       Social Drivers of Health (SDOH) Interventions SDOH Screenings   Food Insecurity: No Food Insecurity (06/08/2024)  Housing: Low Risk  (06/08/2024)  Transportation Needs: No Transportation Needs (06/08/2024)  Utilities: Not At Risk (06/08/2024)  Tobacco Use: Medium Risk (06/09/2024)     Readmission Risk Interventions    06/10/2024    3:14 PM  Readmission Risk Prevention Plan  Post Dischage Appt Complete  Medication Screening Complete  Transportation Screening Complete

## 2024-06-16 ENCOUNTER — Encounter: Payer: Self-pay | Admitting: Skilled Nursing Facility1

## 2024-06-16 ENCOUNTER — Encounter: Payer: MEDICAID | Admitting: Skilled Nursing Facility1

## 2024-06-16 DIAGNOSIS — E109 Type 1 diabetes mellitus without complications: Secondary | ICD-10-CM | POA: Diagnosis present

## 2024-06-16 NOTE — Progress Notes (Signed)
 Appt start time: 11:57 End time: 1:15; Type 1 DM; unsure at this time if anibodies were tested  Pt arrives with his mother.  Pt and his mother state they picked up the Novolog  but not the Lantus  stating they do not have the bus fair to make it back to the pharmacy to pick up the long acting insulin : dietitian called pts pharmacy in appt to as the pt and the pts mother states they could not/did not know how, so now the pts medications will be at the pharmacy that is closer to their home so less of a cost to retreive his insulin . Pt has not been checking his blood sugars yet as he does not know how to use his glucometer: Pt did bring his glucometer to this visit so Dietitian demonstrated with teach back how to use his glucometer: reading in visit 367.   Due to pt and his mothers cognitive state education given was succinct and included hands on activities to ensure pt understood.  Dietitian educated pt through hands on methods how to check his blood sugar Advised pt if anytime blood sugars below 70 to treat; if experiencing symptoms of DKA call doctor or go to the ED (SOB, N/V)  Will discuss foods at next visit as acute priority is understanding his insulin  and getting his insulin  at this time. Pt was given food lists and what category they fall.   Inject 0-6 Units into the skin 3 (three) times daily with meals. Check Blood Glucose (BG) and inject per scale: BG <150= 0 unit; BG 150-200= 1 unit; BG 201-250= 2 unit; BG 251-300= 3 unit; BG 301-350= 4 unit; BG 351-400= 5 unit; BG >400= 6 unit and Call Primary Care.         Hadnouts given/Goals received: DM type 1 Book printed and bookmarked in color for clearer understanding  part 1: Checking Your Blood Sugar Your blood sugar tells you how much sugar is in your blood. Checking it helps you know if you need more or less insulin . What you need: A blood sugar meter Test strips A lancing device (a small tool to get a tiny drop of blood) Lancets (small,  sterile needles for the lancing device) Alcohol wipes or soap and water Steps: Wash your hands! Use soap and water or an alcohol wipe. This gets rid of germs and extra sugar that might be on your fingers.  Get your meter ready. Put a new test strip into your blood sugar meter. It will turn on and be ready for your blood.  Get a tiny drop of blood. Put a new lancet into your lancing device. Press the lancing device gently against the side of your finger. Press the button to get a tiny prick. It won't hurt much! Squeeze your finger gently until a small drop of blood comes out.  Touch the blood to the test strip. The meter will suck the blood into the strip.  Read your number! In a few seconds, your blood sugar number will show on the screen. Write it down or remember it to tell your grown-up or doctor.  [place]  Have your doctor [place] all prescriptions and refills [place]   Inject 0-6 Units into the skin 3 (three) times daily with meals.  Check Blood Glucose (BG) and inject per scale: BG <150= 0 unit; Blood Glucose 150-200= 1 unit; BG 201-250= 2 unit; BG 251-300= 3 unit; BG 301-350=  4 unit; BG 351-400= 5 unit; BG >400= 6 unit       Blue=Novolog  take this whenever blood sugars are out of range and it has been at least 3 hours since taking a dose of novolog   Any sugar number under 70 is BAD, fix it with an orange or orange juice or some soda  Take the blue pen about 10-15 minutes BEFORE you are about to eat a meal WITH carb in it (like bread, fruit, beans, potatoes): If you do NOT have carb in your meal then you do NOT take the blue pen Check your blood sugar 4 times a day: once when you first wake up and before each meal  Follow up: 2 weeks

## 2024-09-02 ENCOUNTER — Encounter (INDEPENDENT_AMBULATORY_CARE_PROVIDER_SITE_OTHER): Payer: MEDICAID | Admitting: Family
